# Patient Record
Sex: Male | Born: 1945 | Race: White | Marital: Single | Smoking: Never smoker
Health system: Northeastern US, Academic
[De-identification: ages and names within clinical notes are randomized; demographics above are authoritative.]

## PROBLEM LIST (undated history)

## (undated) DIAGNOSIS — E785 Hyperlipidemia, unspecified: Secondary | ICD-10-CM

## (undated) DIAGNOSIS — K219 Gastro-esophageal reflux disease without esophagitis: Secondary | ICD-10-CM

## (undated) DIAGNOSIS — G4730 Sleep apnea, unspecified: Secondary | ICD-10-CM

## (undated) DIAGNOSIS — S42123A Displaced fracture of acromial process, unspecified shoulder, initial encounter for closed fracture: Secondary | ICD-10-CM

## (undated) DIAGNOSIS — I10 Essential (primary) hypertension: Secondary | ICD-10-CM

## (undated) DIAGNOSIS — M1990 Unspecified osteoarthritis, unspecified site: Secondary | ICD-10-CM

## (undated) DIAGNOSIS — K922 Gastrointestinal hemorrhage, unspecified: Secondary | ICD-10-CM

## (undated) DIAGNOSIS — H35371 Puckering of macula, right eye: Secondary | ICD-10-CM

## (undated) DIAGNOSIS — H53141 Visual discomfort, right eye: Secondary | ICD-10-CM

## (undated) HISTORY — PX: CONVERTED PROCEDURE: SHX9999

## (undated) HISTORY — DX: Visual discomfort, right eye: H53.141

## (undated) HISTORY — DX: Puckering of macula, right eye: H35.371

## (undated) HISTORY — PX: ELBOW SURGERY: SHX618

## (undated) HISTORY — PX: TOTAL KNEE ARTHROPLASTY: SHX125

## (undated) HISTORY — PX: APPENDECTOMY: SHX54

## (undated) HISTORY — PX: CARPAL TUNNEL RELEASE: SHX101

## (undated) HISTORY — PX: SHOULDER SURGERY: SHX246

---

## 1999-01-15 ENCOUNTER — Inpatient Hospital Stay (HOSPITAL_COMMUNITY): Admission: RE | Admit: 1999-01-15 | Discharge: 1999-01-19 | Payer: Self-pay | Admitting: Orthopedic Surgery

## 1999-01-18 ENCOUNTER — Encounter: Payer: Self-pay | Admitting: Orthopedic Surgery

## 2000-07-07 ENCOUNTER — Encounter (INDEPENDENT_AMBULATORY_CARE_PROVIDER_SITE_OTHER): Payer: Self-pay

## 2000-07-07 ENCOUNTER — Ambulatory Visit (HOSPITAL_COMMUNITY): Admission: RE | Admit: 2000-07-07 | Discharge: 2000-07-07 | Payer: Self-pay | Admitting: Gastroenterology

## 2001-05-28 ENCOUNTER — Encounter (INDEPENDENT_AMBULATORY_CARE_PROVIDER_SITE_OTHER): Payer: Self-pay | Admitting: Specialist

## 2001-05-28 ENCOUNTER — Ambulatory Visit (HOSPITAL_COMMUNITY): Admission: RE | Admit: 2001-05-28 | Discharge: 2001-05-28 | Payer: Self-pay | Admitting: Gastroenterology

## 2002-12-09 ENCOUNTER — Encounter (INDEPENDENT_AMBULATORY_CARE_PROVIDER_SITE_OTHER): Payer: Self-pay | Admitting: *Deleted

## 2002-12-09 ENCOUNTER — Ambulatory Visit (HOSPITAL_COMMUNITY): Admission: RE | Admit: 2002-12-09 | Discharge: 2002-12-09 | Payer: Self-pay | Admitting: Gastroenterology

## 2005-10-31 ENCOUNTER — Encounter: Admission: RE | Admit: 2005-10-31 | Discharge: 2005-10-31 | Payer: Self-pay | Admitting: Gastroenterology

## 2006-12-10 IMAGING — CT CT ABDOMEN W/ CM
3 of 5 series · 13 of 42 positions shown, 19 images · IV contrast (READY/WATER & [ID] [ID])
Comparison: None.
COMPARISON: None.

CLINICAL DATA: Eight days of left lower quadrant tenderness, cramping.  On antibiotics for one day.  Appendectomy.
ABDOMEN CT WITH CONTRAST:
TECHNIQUE: Multidetector CT imaging of the abdomen was performed following the standard protocol during bolus administration of intravenous contrast.
Contrast:  100 cc of Omnipaque 300.
TECHNIQUE: Multidetector CT imaging of the pelvis was performed following the standard protocol during bolus administration of intravenous contrast.

[Series 2: a&p w/ · axial · 0.70mm/px · z∈[-396,-50]mm · 8 of 88 slices shown, 13 images (1 of 2)]
[im 10/88  soft-tissue]
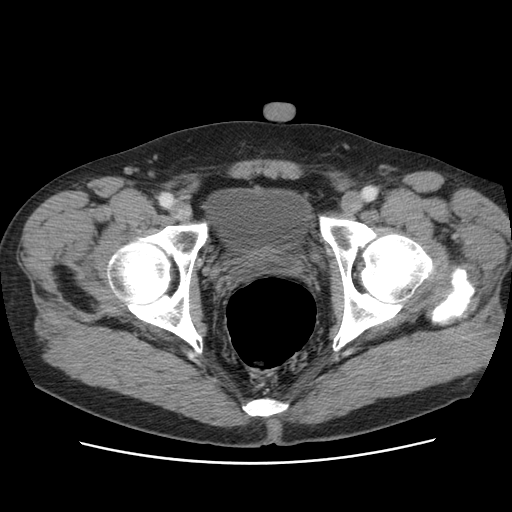
[im 10/88  bone]
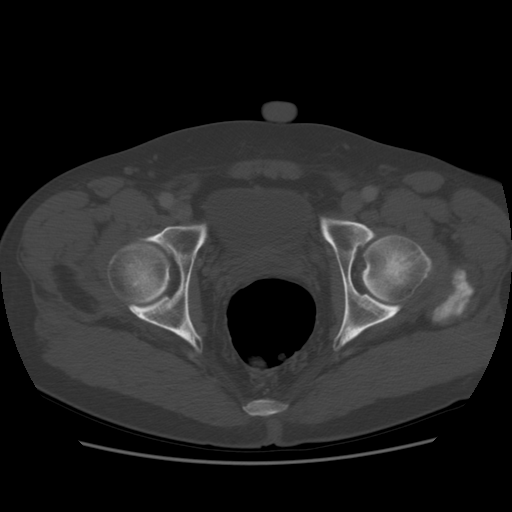
[im 20/88  soft-tissue]
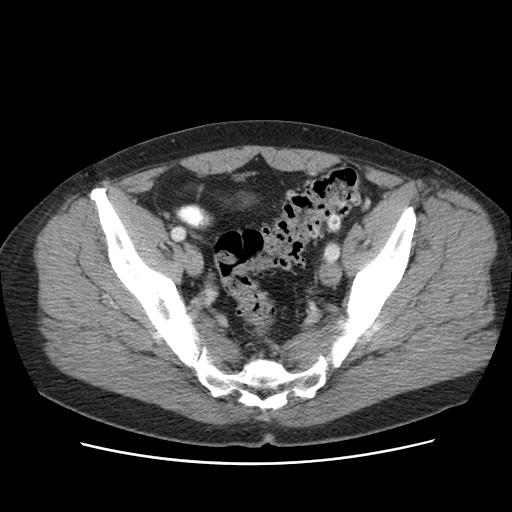
[im 30/88  soft-tissue]
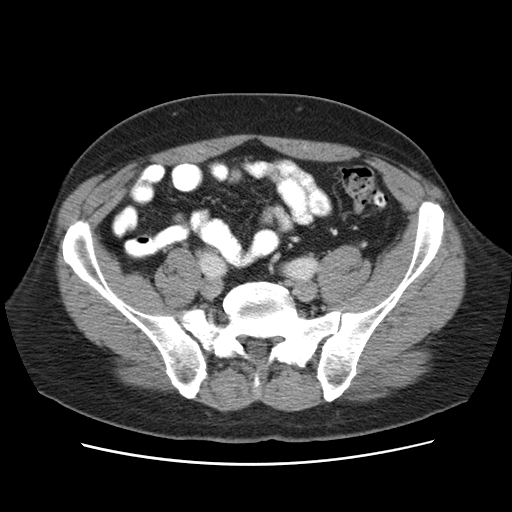
[im 39/88  soft-tissue]
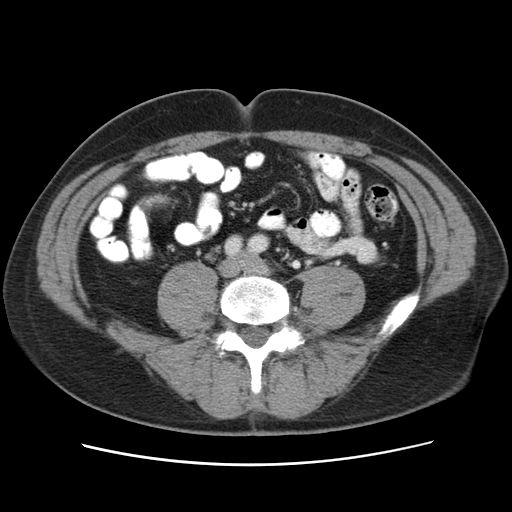
[im 49/88  soft-tissue]
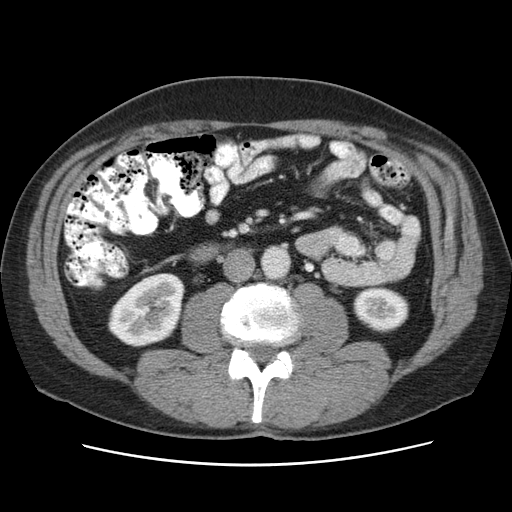
[im 49/88  lung]
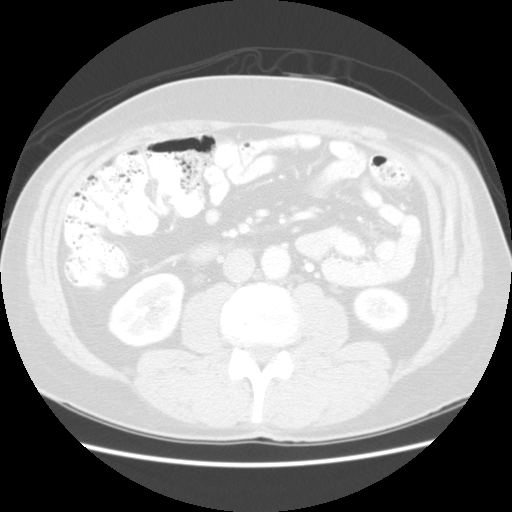
[im 59/88  soft-tissue]
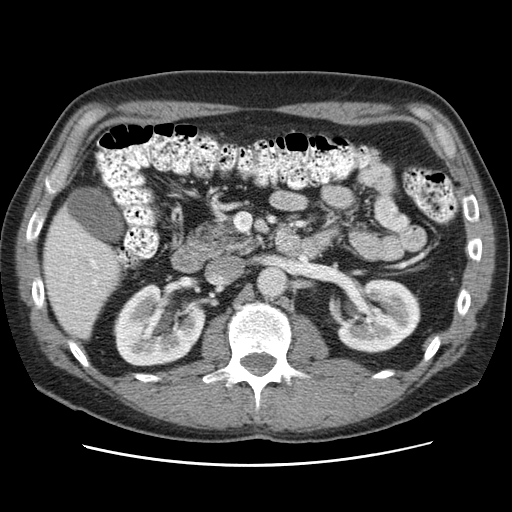
[im 59/88  lung]
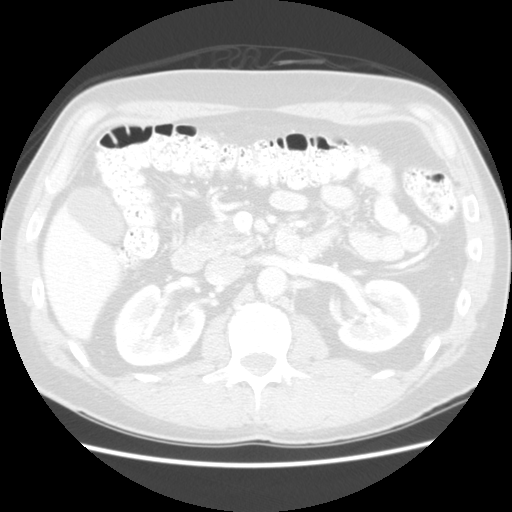
[im 68/88  soft-tissue]
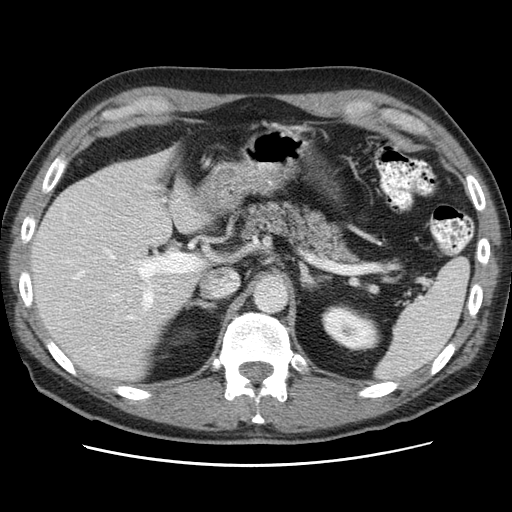
[im 68/88  lung]
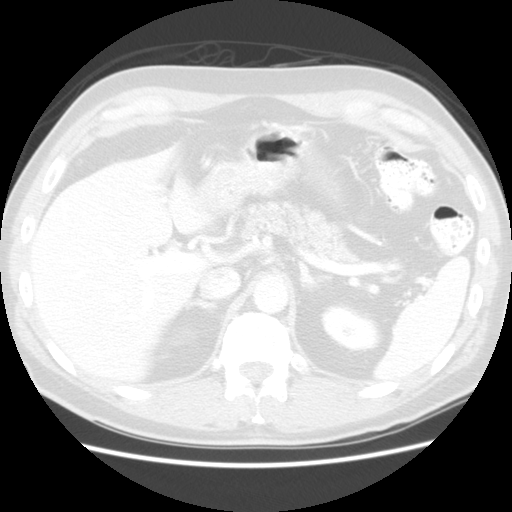
[im 78/88  soft-tissue]
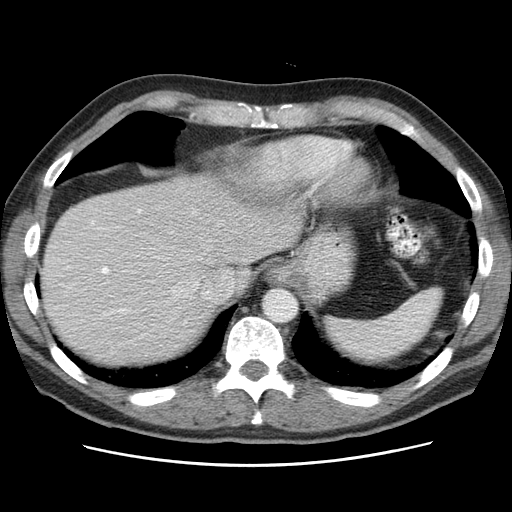
[im 78/88  lung]
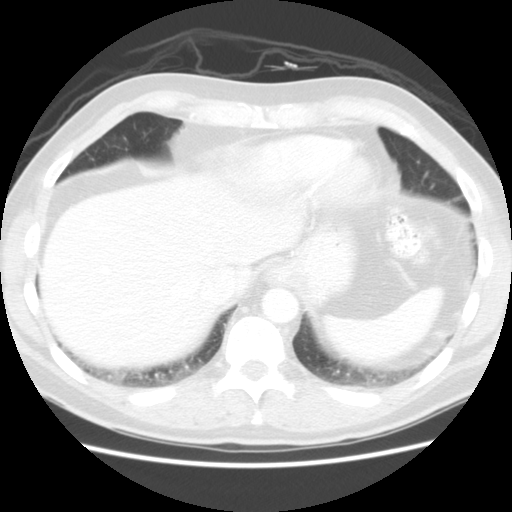

[Series 102: a&p w/ · axial · 0.70mm/px · z∈[-168,-146]mm · 2 of 152 slices shown (2 of 2)]
[im 18/152  soft-tissue]
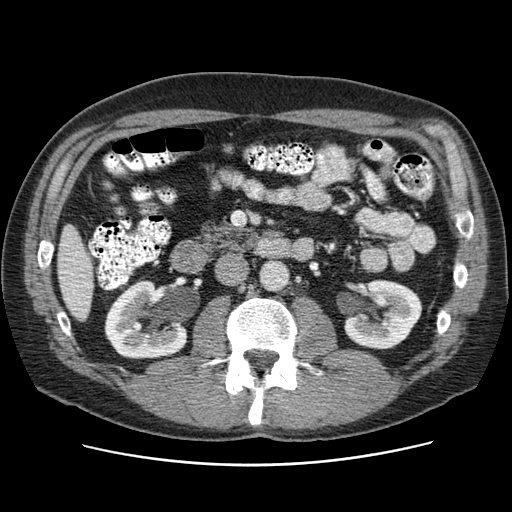
[im 36/152  soft-tissue]
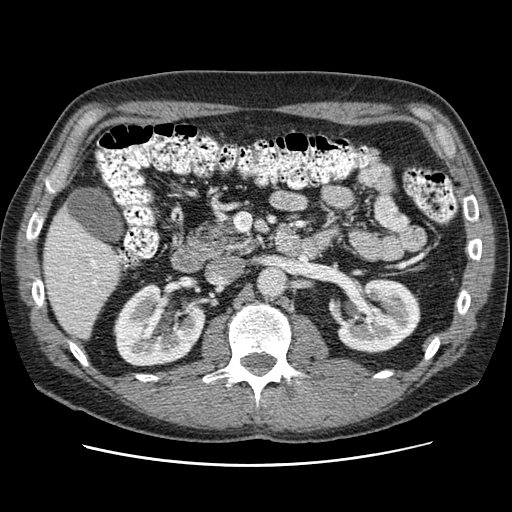

[Series 408: reformatted · sagittal · 0.92mm/px · 3 of 142 slices shown, 4 images]
[im 29/142  soft-tissue]
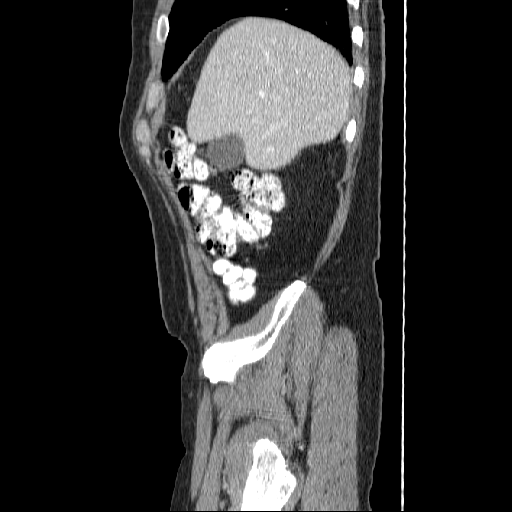
[im 57/142  soft-tissue]
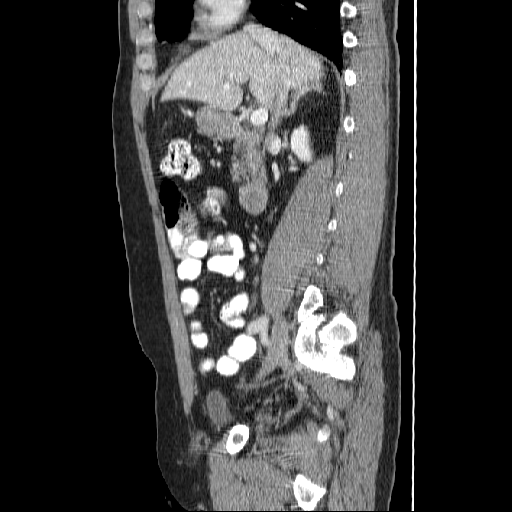
[im 57/142  bone]
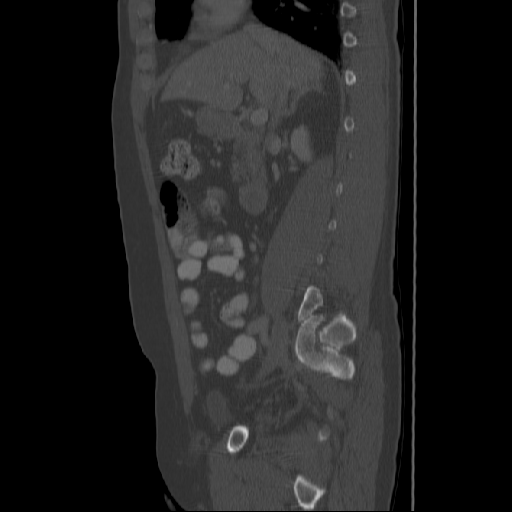
[im 85/142  soft-tissue]
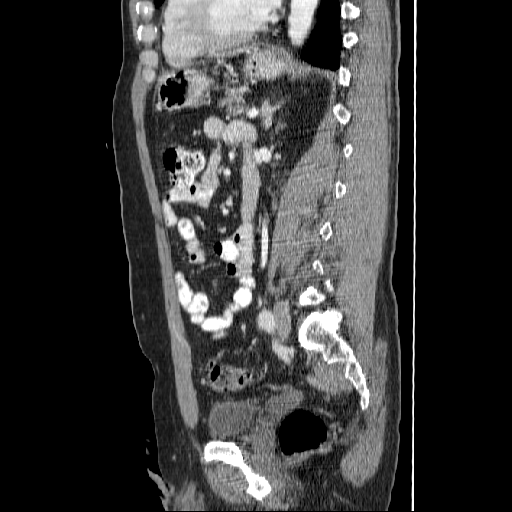

[13 of 42 positions shown; findings below may reference images not displayed]

The abdominal parenchymal organs are unremarkable.  There is no evidence of mass or adenopathy.  No inflammatory process or abnormal fluid collections are identified.  No other significant abnormality is noted.  Bilateral extrarenal pelves anatomic variant and tiny cortical renal cysts are seen at the lower pole kidneys with incidental very small hiatus hernia and slight constipation findings.
IMPRESSION: 1.  Slight constipation, tiny renal cortical cysts, and very small hiatus hernia.
2.  Otherwise negative abdomen CT.
PELVIS CT WITH CONTRAST:
FINDINGS: Slight to moderate descending sigmoid diverticulosis is seen with mild stranding at the proximal sigmoid region of minimal acute diverticulitis findings.  No complicating perforation, large phlegmon, nor abscess is seen with no free fluid nor free intraperitoneal air.  Slight constipation findings present.  Patient is post appendectomy with remaining organs unremarkable.
IMPRESSION: 1.  Descending sigmoid diverticulosis with minimal acute diverticulitis findings at the level of the proximal sigmoid.
2.  Otherwise negative.

## 2007-01-10 DIAGNOSIS — I2510 Atherosclerotic heart disease of native coronary artery without angina pectoris: Secondary | ICD-10-CM | POA: Insufficient documentation

## 2007-03-24 ENCOUNTER — Ambulatory Visit (HOSPITAL_BASED_OUTPATIENT_CLINIC_OR_DEPARTMENT_OTHER): Admission: RE | Admit: 2007-03-24 | Discharge: 2007-03-24 | Payer: Self-pay | Admitting: Orthopedic Surgery

## 2008-07-07 ENCOUNTER — Other Ambulatory Visit: Payer: Self-pay | Admitting: Cardiology

## 2008-08-23 ENCOUNTER — Encounter: Admission: RE | Admit: 2008-08-23 | Discharge: 2008-08-23 | Payer: Self-pay | Admitting: Gastroenterology

## 2008-09-16 ENCOUNTER — Other Ambulatory Visit: Payer: Self-pay | Admitting: Cardiology

## 2008-10-06 ENCOUNTER — Other Ambulatory Visit: Payer: Self-pay

## 2008-10-06 ENCOUNTER — Encounter: Payer: Self-pay | Admitting: Cardiology

## 2008-10-06 DIAGNOSIS — I2510 Atherosclerotic heart disease of native coronary artery without angina pectoris: Secondary | ICD-10-CM | POA: Insufficient documentation

## 2008-10-06 DIAGNOSIS — E785 Hyperlipidemia, unspecified: Secondary | ICD-10-CM | POA: Insufficient documentation

## 2008-10-06 DIAGNOSIS — I498 Other specified cardiac arrhythmias: Secondary | ICD-10-CM | POA: Insufficient documentation

## 2008-10-06 HISTORY — DX: Other specified cardiac arrhythmias: I49.8

## 2008-10-27 ENCOUNTER — Other Ambulatory Visit: Payer: Self-pay | Admitting: Cardiology

## 2008-12-01 ENCOUNTER — Encounter: Payer: Self-pay | Admitting: Cardiology

## 2008-12-01 ENCOUNTER — Other Ambulatory Visit: Payer: Self-pay

## 2009-05-30 ENCOUNTER — Ambulatory Visit: Payer: Self-pay | Admitting: Cardiology

## 2009-06-01 ENCOUNTER — Encounter: Payer: Self-pay | Admitting: Gastroenterology

## 2009-06-01 ENCOUNTER — Ambulatory Visit: Payer: Self-pay | Admitting: Cardiology

## 2009-06-06 NOTE — Consults (Signed)
Client Name: Terry, Grant  Client ID (MRN): [id number]  Care Terry Grant (Primary): Dr. Cindra Eves  Care Terry Grant (Referral): Dr. Cindra Eves  Care Terry Grant (Service): Macky Lower. Terry Medicus, MD  Client Date of Birth (Age): 02-09-1946 (64 years old)  Visit Date (Time): 06/01/2009 (11:00 AM)  Visit Service Area/Type: CLIN: Office Visit (Follow up)  Visit Patient Type: Outpatient  Visit Lock Code (Date/Time): [phone] (06/06/2009 at 11:24:51)                                 ******BODY OF REPORT******    Dear Dr. Omelia Blackwater:    I was privileged to see your patient, Terry Grant, in the [facility] Office today in routine follow   up.  The patient was initially seen by me 07/07/2008 for initial visit   at which time he indicated he had premature ventricular contractions   and nonsustained ventricular tachycardia during stent and had a history   of coronary disease and was concerned about these findings. They   appeared to be associated with palpitations although there was a lack   of correlation of symptoms and ectopy by Holter noted in the 03/2008   [facility] evaluation.  The patient expressed concern about the   nonsustained ventricular tachycardia and I advised him that this was   likely not of important significance to his health status.  He does   have a history of dyslipidemia and multiple coronary risk factors and   had a slight orthostatic drop of 10 mm from supine to standing   position.    Today in clinic the patient indicates I had advised him to discontinue   the use of Xanax and he indicates this was difficult for him but he   eventually got off of it which was difficult.  He experienced   significant depression, he reports, after getting of the Xanax and has   found this to be a significant adjustment.  He says he has not felt   well since stopping the Xanax.  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  He   says he gets up and walks around when he is twitching so much, this is   often noted in the morning.  He has been taking coated aspirin every   other day on an empty stomach, he indicates.  He says that irregular   heartbeats and PVCs he thinks were higher on the Xanax and he wonders   if a beta-blocker should be used.  He describes a 10 pound weight loss   and is frequently running, but seems to be unable to regain the weight.    He denies other cardiac symptoms such as unusual fatigue, chest pain,   dyspnea, or other cardiac symptom.    MEDICATIONS:  Medications were reviewed and include:  1.   Aspirin every other day.  2.   Plavix 75 mg daily.  3.   Crestor 10 mg daily.  4.   Fish oil 1,200 mg daily over-the-counter.  5.   Vitamin B.    REVIEW OF SYSTEMS:  The patient complains of some twitches in the interosseous muscles of   his hands particularly between the index finger and thumb on  each hand.    PHYSICAL EXAMINATION:  General:  He is a thin middle aged gentleman appearing well and in no   acute distress.  Vital Signs:  Blood pressure is 113/73, heart rate 83 and regular.    Weight 153 pounds.  Neck:  There is no jugular venous hypertension.  Lungs:  Clear to percussion and auscultation.  Cardiovascular:  Cardiac tones are normal without gallop or murmur.    Peripheral pulses are 3+ to 4+ and symmetrically intact in the upper   and lower extremities.  Comprehensive cardiovascular examination   reveals no aortic abdominal pathology.  Abdomen:  There is no organomegaly, mass or tenderness.  Extremities:  Without clubbing, cyanosis or edema.  Skin:  Unremarkable.  Neurologic:  He appears grossly alert and oriented with no normal   motor, sensory, cerebellar, gait and cranial nerve function.    ELECTROCARDIOGRAM:  I did not obtain a follow up electrocardiogram today.    IMPRESSION:  1.   Terry Grant is a 64 year old man with a history of  coronary disease   treated with angioplasty and 4 drug eluting stents in 01/2008 by Dr.   Cleatis Polka in [place].  Because of the drug eluting stents it is   recommended that he continue with Plavix therapy on an ongoing basis   until it becomes more firmly established and the literature as to when   an optimal time for discontinuation might be in the presence of drug   eluting stents.  Certainly Crestor 10 mg is useful for reducing his   risk of recurrence coronary disease events, although it is not clear   that he had an acute coronary syndrome based on the history he has   provided prior to the placement of stents which by angiography clearly   did indicate the presence of significant anatomic coronary disease.  2.   He has palpitations of unclear etiology.  They do not seem to be   associated with defined ectopy.  There seems to be no correlation of   his symptoms and objective evidence of dysrhythmia at the time of his   Holter monitor last year.  3.   His lipids are well controlled with current therapy and given the   presence of disease I would recommend ongoing therapy with lipid   management.  4.   He complains of fatigue an post-Xanax depression, and wonders   about the use of beta-blockade.  I have advised him that while   beta-blockade can be used therapeutically to reduce anxiety and has   been used effectively to reduce anxiety associated with public   speaking, it does have a potential downside of worsening depression to   which he may be malleable.  Since the use of beta-blockade in this   context would be non-cardiac, I have urged him to work with you in   order to establish more properly the risks and benefits of this form of   therapy if it used for purposes of control of anxiety.  5.   History of interosseous muscle twitching.  I have not observed any   fasciculations on examination, and have asked him to follow up with you   for primary care assessment of the significance of this symptom he   reports  of interosseous muscle twitching.  6.   I have asked the patient to take aspirin with food in his stomach   and to avoid taking aspirin on an empty stomach as this may  initiate or   enhance GI irritability.    I hope this cardiovascular consultation has been of assistance to you   and your management of Terry Grant and hope you will not hesitate to call   for any questions or concerns that may arise.        Sincerely,  ***Electronically signed by***  _________________________  Macky Lower. Terry Medicus, MD  Professor of Medicine                            ******REPORT DISTRIBUTION LIST******    PRIMARY CARE Klare Criss: Stefano Gaul Elver Stadler: Cindra Eves

## 2009-11-30 ENCOUNTER — Ambulatory Visit: Payer: Self-pay | Admitting: Cardiology

## 2009-11-30 ENCOUNTER — Other Ambulatory Visit: Payer: Self-pay | Admitting: Gastroenterology

## 2009-11-30 NOTE — Progress Notes (Addendum)
Cardiology   Dear Dr. Dayna Barker Terry Grant:     I had the pleasure of seeing your patient Terry Grant in the Outpatient   Cardiology Clinic at the [facility] on November 30, 2009. As   you know, Terry Grant is a 64 year old male with a history of CAD s/p DES to   RCA, hyperlipidemia, SVT.  Terry Grant presents today for routine follow-up.    His main complaint today is a  continued sensation of palpitations   associated with "adrenaline rushes" such as watching [place]   football, and occasionally playing golf.  Previous evaluation with a Holter   monitor did not correlate these episodes with any arrythmia.  He denies   chest pain, shortness of breath. He is eating a healthy diet and exercises   daily for 60 minutes.       He has multiple questions today including if he can.  Allergies   No Known Drug Allergy.  Current Meds   Aspirin 81 MG Tablet;TAKE 1 TABLET EVERY OTHER DAY; RPT  Plavix 75 MG Tablet;TAKE 1 TABLET DAILY.; RPT  Fish Oil 1200 MG Capsule;TAKE 2 CAPSULE DAILY; RPT  Vitamin B Complex Capsule;TAKE 1 CAPSULE DAILY.; RPT  Vitamin D CAPS;TAKE 1 CAPSULE DAILY; RPT  Crestor 10 MG Tablet;TAKE 1 TABLET DAILY.; RPT  Magnesium 250 MG Tablet;; RPT.  Active Problems   Cath Stent 1 Distal Right Coronary Artery 10 Jan 2007  Coronary Artery Disease (414.00)  Dyslipidemia (272.4)  Supraventricular Tachycardia (427.89).  Vital Signs   Recorded by PAYETTE,JENNIFER on 30 Nov 2009 10:49 AM  BP:116/67,  RUE,  Sitting,   HR: 65 b/min,   Weight: 151 lb,   Pain Scale: 0.  Physical Exam   Gen: NAD, AandO  HEENT: anicteric, MMM  Pulm: CTA(B), normal respiratory effort  CV: RRR no M/R/G.  no carotid bruits.  2+ radial pulses b/l.  no palpable   abdominal masses.  Ext: no edema  .  Plan   1.  Hyperlipidemia - dyslipidemia is well controlled on crestor 10 with LDL   of 73 and HDL > 100.    -At this point would advise no in current regimen of crestor 10mg .      2.  CAD s/p DES to RCA - patient remains asymptomatic without  signs or   symptoms of ongoing ischemia.  -advised to continue aspirin and plavix.  Terry Grant was interested in   stopping plavix, however, given the risk of restenosis we advised against   stopping it.       3.  Palpitations - Previous holter monitor exams did not correlate symptoms   with concerning arrythmia.   As the symptoms improve with exercise they are   quite unlikely to be caused by ischemia.  As the symptoms are distressing   to him we have recommended a very low dose beta-blocker, given his low   resting HR in the 60s.    -toprol xl 12.5mg          .  Signature   Electronically signed by: Wendee Beavers  MD - Res.; 11/30/2009 2:07 PM EST.

## 2009-12-06 LAB — EKG 12-LEAD
P: 76 degrees
PR: 172 ms
QRS: 120 degrees
QRSD: 143 ms
QT: 448 ms
QTc: 448 ms
Rate: 60 {beats}/min
Severity: ABNORMAL
T: 22 degrees

## 2010-03-20 NOTE — Miscellaneous (Unsigned)
Continuity of Care Record  Created: todo  From: LUTHER, RAMESH  From:   From: [employer], EHR v10.2.7.53  To: Melinda Crutch  Purpose: Patient Use;       Problems  Diagnosis: Coronary Artery Disease (414.00)   Diagnosis: Dyslipidemia (272.4)   Diagnosis: Supraventricular Tachycardia (427.89)   Problem: Cath Stent 1 Distal Right Coronary Artery 10 Jan 2007    Alerts  Allergy - No Known Drug Allergy     Medications  Aspirin 81 MG Tablet; TAKE 1 TABLET EVERY OTHER DAY ; RPT   Crestor 10 MG Tablet; TAKE 1 TABLET DAILY. ; RPT   Fish Oil 1200 MG Capsule; TAKE 2 CAPSULE DAILY ; RPT   Magnesium 250 MG Tablet ; RPT   Metoprolol Succinate 25 MG Tablet Extended Release 24 Hour; TAKE 0.5 TABLET   DAILY ; Rx   Plavix 75 MG Tablet; TAKE 1 TABLET DAILY. ; RPT   Vitamin B Complex Capsule; TAKE 1 CAPSULE DAILY. ; RPT   Vitamin D CAPS; TAKE 1 CAPSULE DAILY ; RPT

## 2010-06-05 ENCOUNTER — Ambulatory Visit: Payer: Self-pay | Admitting: Cardiology

## 2010-06-14 ENCOUNTER — Ambulatory Visit: Payer: Self-pay | Admitting: Cardiology

## 2010-06-26 ENCOUNTER — Ambulatory Visit: Payer: Self-pay | Admitting: Cardiology

## 2010-07-24 NOTE — Op Note (Signed)
NAME:  Roger Hays, Roger Hays                 ACCOUNT NO.:  [id number]   MEDICAL RECORD NO.:  [id number]          PATIENT TYPE:  AMB   LOCATION:  DSC                          FACILITY:  [facility]   PHYSICIAN:  Robert A. Thurston Hole, M.D. DATE OF BIRTH:  Sep 13, 1945   DATE OF PROCEDURE:  03/24/2007  DATE OF DISCHARGE:                               OPERATIVE REPORT   PREOPERATIVE DIAGNOSES:  1. Right elbow lateral epicondylitis with partial tear.  2. Right elbow medial epicondylitis.   POSTOPERATIVE DIAGNOSES:  1. Right elbow lateral epicondylitis with partial tear.  2. Right elbow medial epicondylitis.   PROCEDURE:  1. Right elbow lateral release with debridement.  2. Partial lateral epicondylectomy and repair.  3. Right elbow medial epicondylar injection.   SURGEON:  Salvatore Marvel, MD.   ASSISTANT:  Julien Girt, PA.   ANESTHESIA:  General.   OPERATIVE TIME:  45 minutes.   COMPLICATIONS:  None.   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  He is now to undergo lateral release  and medial epicondylar injection.   DESCRIPTION:  Roger Hays was brought to the operating room on March 24, 2007, placed on the operative table in the supine position.  After an  adequate level of general anesthesia was obtained his right elbow was  examined.  He had full range of motion and his elbow was stable to  ligamentous exam.  The right arm was prepped using sterile DuraPrep and  draped using sterile technique.  He received Ancef 1 gram IV  preoperatively for prophylaxis.  The arm was exsanguinated and a  tourniquet elevated at 275 mm.  Initially, through a 3 cm curvilinear  lateral incision based over the lateral epicondyle  initial exposure was  made.  The underlying subcutaneous tissues were incised.  The fascia  over the ECRB and ECRL tendons was incised longitudinally.  The ECRB and  ECRL tendons showed partial tearing and the partially torn pieces were  debrided and there was release of the ECRB an ECRL tendon.  Underneath  this there was a spur noted in the lateral epicondyle which was removed  with a rongeur and then smoothed down with a rasp.  The radial head  capitella joint was not entered.  After the debridement was carried out  back to good healthy tendon, multiple small drill holes were placed in  the lateral epicondyle and then #2-0 FiberWire was placed through these  drill holes and through the healthy appearing tendon and then tied down,  reattaching the tendon 2-3 mm distal to its original attachment under  less tension with a firm and tight repair.  The radial nerve was  carefully protected during this portion of the procedure and during the  exposure.  After this was done the elbow could be brought through a full  range  of motion with no excessive tension on the repair.  The wound was  then irrigated and the fascia was closed over the repair with #2-0  Vicryl suture, subcutaneous tissue was closed with #2-0 Vicryl,  subcuticular layer closed with #4-0 Prolene.  At this point, the medial  epicondyle was injected sterilely with 40 mg of Depo-Medrol and 2 mL of  0.25% Marcaine staying away from the ulnar nerve.  After this was done  sterile dressings and a long-arm splint was applied after the lateral  incision was also injected with 0.25% Marcaine.  A long-arm splint was  placed, tourniquet was released and then the patient awakened and taken  to the recovery room in stable condition.   FOLLOWUP CARE:  1. Roger Hays will be followed as an outpatient on Darvocet, Dilaudid      if needed, and Robaxin.  2. See me back in the office in a week for wound check and followup.   Needle and sponge  counts correct x2 at the end of the case.      Robert A. Thurston Hole, M.D.  Electronically Signed     RAW/MEDQ  D:  03/24/2007  T:  03/24/2007  Job:  [id number]

## 2010-07-27 NOTE — Procedures (Signed)
[facility]  Patient:    Roger Hays, Roger Hays                        MRN: [id number] Proc. Date: 07/07/00 Adm. Date:  [id number] Attending:  Rich Brave CC:         Dr. Arlis Porta   Procedure Report  PROCEDURE:  Upper endoscopy with biopsies.  SURGEON:  Florencia Reasons, M.D.  INDICATIONS:  A 65 year old gentleman for evaluation of longstanding reflux and tendency toward loose stools.  FINDINGS:  A 3 cm segment of Barretts esophagus above a 3 cm hiatal hernia.  DESCRIPTION OF PROCEDURE:  The nature, purpose, and risks of the procedure had been discussed with the patient who provided written consent.  Sedation was fentanyl 62.5 mcg and Versed 7 mg IV without arrhythmias or desaturation.  The Olympus video endoscope was passed under direct vision.  The vocal cords were basically normal, perhaps a little bit of inflammatory changes from reflux were present such as some thickening in the posterior commissure.  The proximal esophagus was normal, being entered very easily under direct vision.  The distal esophagus had several tongues of typical Barretts-appearing mucosa, extending a total of about 3 cm above the lower esophageal sphincter.  There was a 3 cm hiatal hernia present without any ring or stricture.  No varices, infection, or neoplasia were noted.  The stomach contained a small clear residual which was suctioned up.  The gastric mucosa was normal, without evidence of gastritis, erosions, ulcers, polyps, or masses.  A retroflexed view of the proximal stomach showed the hiatal hernia from its inferior perspective, but was otherwise normal.  The diaphragmatic hiatus was not particularly patchulous.  The pylorus, duodenal bulb, and second duodenum were unremarkable in appearance.  Biopsies obtained during this procedure included antral biopsies for CLOtesting, random duodenal biopsies to rule celiac disease in view of the several year history of  loose stools, and biopsies from the Barretts segment of the esophagus.  The procedure was well-tolerated by the patient and there were no apparent complications.  IMPRESSION: 1. Barretts esophagus. 2. Medium-sized hiatal hernia. 3. No definite source of diarrhea or loose stools identified.  PLAN:  Await results of biopsies and CLOtest.  Proceed to colonoscopic evaluation. DD:  07/07/00 TD:  07/07/00 Job: [id number]

## 2010-07-27 NOTE — Op Note (Signed)
   NAME:  [name], [name] NO.:  [id number]   MEDICAL RECORD NO.:  [id number]                   PATIENT TYPE:  AMB   LOCATION:  ENDO                                 FACILITY:  [facility]   PHYSICIAN:  Bernette Redbird, M.D.                DATE OF BIRTH:  [birth year]   DATE OF PROCEDURE:  12/09/2002  DATE OF DISCHARGE:                                 OPERATIVE REPORT   PROCEDURE:  Upper endoscopy with biopsies.   INDICATIONS:  Followup of focal atypia in a Barrett's segment of a 65-year-  old gentleman, last endoscoped about 1-1/2 years ago and maintained on PPI  therapy.   FINDINGS:  Short segment Barrett's esophagus with free reflux noted.  Small  hiatal hernia.   PROCEDURE:  The nature, purpose, and risks of the procedure were familiar to  the patient, who provided written consent.   SEDATION:  Fentanyl 75 micrograms and Versed 7.5 mg IV without arrhythmias  or desaturation.   The Olympus small caliber adult video endoscope was passed under direct  vision.  The vocal cords looked grossly normal.  The esophagus was readily  entered and was normal except for its last few centimeters where there was a  broken up segment of Barrett's esophagus with islands of squamous mucosa.  No active esophagitis was noted, but the patient did have free reflux.  No  ring or stricture was appreciated, but the patient had a 2 cm hiatal hernia.  The stomach contained a small clear residual which was suctioned up and the  gastric mucosa was normal including a retroflexed view of the cardia.  The  pylorus, duodenal bulb, and second duodenum were grossly normal.   Approximately 12 biopsies were obtained from the distal esophagus Barrett's  segment prior to removal of the scope.  The patient tolerated the procedure  well and there were no apparent complications.   IMPRESSION:  Short segment Barrett's esophagus above small hiatal hernia.   PLAN:  Await pathology with followup  endoscopy to be determined according to  the histologic findings.                                               Bernette Redbird, M.D.    RB/MEDQ  D:  12/09/2002  T:  12/09/2002  Job:  [id number]   cc:   Newt Minion  [address], [place]  [place]  [place] [address]  Fax: [phone]

## 2010-07-27 NOTE — Procedures (Signed)
[facility]  Patient:    Roger Hays, Roger Hays Visit Number: [id number] MRN: [id number]          Service Type: END Location: ENDO Attending Physician:  Rich Brave Dictated by:   Florencia Reasons, M.D. Proc. Date: 05/28/01 Admit Date:  05/28/2001 Discharge Date: 05/28/2001   CC:         Newt Minion, M.D., [facility], [place]   Procedure Report  PROCEDURE:  Upper endoscopy with biopsies.  INDICATION:  A 65 year old for follow up of Barretts esophagus.  Endoscopy approximately a year ago showed biopsies which were indeterminate for dysplasia.  The patient has been maintained on PPI therapy in the interim.  FINDINGS:  A 2-3 cm segment of Barretts esophagus above 2-3 cm hiatal hernia.  DESCRIPTION OF PROCEDURE:  The nature, purpose, and risks of the procedure were familiar to the patient from prior examination, and he provided written consent.  Sedation was fentanyl 100 mcg and Versed 10 mg IV without arrhythmias or desaturation.  The scope was passed under direct vision,  The larynx and vocal cords looked normal.  The esophagus was readily entered.  The esophagus was normal all the way down to the Barretts segment which began at about 35 cm from the mouth, and extended for a distance of 2-3 cm.  There was a 2-3 cm hiatal hernia present as well.  The stomach contained no significant residual and had no evidence of gastritis, erosions, ulcers, polyps, or masses including a retroflexed view of the proximal stomach which showed just a slightly patulous diaphragmatic hiatus.  The pylorus, duodenal bulb, and second duodenum looked normal.  The esophagus was free of varices, infection, or any evident mass effect or neoplasia.  There was no free reflux noted nor any evidence endoscopically of active inflammation.  Approximately 15 biopsies were obtained from the Barretts segment prior to removal of the scope.  The patient tolerated the procedure well,  and there were no apparent complications.  IMPRESSION:  Short segment Barretts esophagus above small hiatal hernia.  PLAN:  Await pathology. Dictated by:   Florencia Reasons, M.D. Attending Physician:  Rich Brave DD:  05/28/01 TD:  05/30/01 Job: [id number]/[id number]

## 2010-07-27 NOTE — Procedures (Signed)
[facility]  Patient:    Roger Hays, Roger Hays                        MRN: [id number] Proc. Date: 07/07/00 Adm. Date:  [id number] Attending:  Rich Brave CC:         Newt Minion, M.D.   Procedure Report  PROCEDURE:  Colonoscopy with biopsies.  ENDOSCOPIST:  Florencia Reasons, M.D.  INDICATIONS:  A 65 year old with intermittent rectal bleeding and change in bowel habits toward loose stools over the past several years.  FINDINGS:  Internal hemorrhoids and sigmoid diverticulosis.  DESCRIPTION OF PROCEDURE:  The nature, purpose, and risks of the procedure had been discussed with the patient who provided written consent.  Sedation for this procedure and the upper endoscopy which preceded it totalled fentanyl 62.5 mcg and Versed 9 mg IV without arrhythmias or desaturation.  Digital exam of the prostate was normal.  The Olympus adult video colonoscope was readily advanced to the terminal ileum, and pullback was then performed.  The quality of the prep was excellent, and it is felt that all areas were well seen.  The terminal ileum had a norma appearance with a little bit of lymphoid hyperplasia.  Several biopsies were obtained from it.  The colonic mucosal appearance was also normal without evidence of colitis or vascular malformations.  No polys or masses were observed.  I did not see any vascular malformations.  Random mucosal biopsies were obtained along the length of the colon and rectum.  There was mild to moderate sigmoid diverticulosis.  Retroflexion in the rectum showed moderate internal hemorrhoids.  The patient tolerated the procedure well, and there were no apparent complications.  IMPRESSION: 1. Internal hemorrhoids which most likely account for the patients rectal    bleeding. 2. Sigmoid diverticulosis. 3. No obvious source for diarrhea or loose stools identified; random    mucosal biopsies pending.  PLAN:  Await  pathology. DD:  07/07/00 TD:  07/07/00 Job: [id number]

## 2010-07-27 NOTE — Procedures (Signed)
[facility]  Patient:    Roger Hays                         MRN: [id number] Proc. Date: 01/15/99 Adm. Date:  [id number] Attending:  Twana First CC:         Bedelia Person, M.D.                           Procedure Report  PROCEDURE:  Epidural catheter placement for postoperative epidural Fentanyl pain control.  ANESTHESIOLOGIST:  Bedelia Person, M.D.  INDICATIONS:  Prior to the surgical procedure, the risks and benefits of postop  epidural Fentanyl for pain control were discussed with the patient by Kaylyn Layer. Michelle Piper, M.D.  Dr. Michelle Piper listed the risks of nerve damage, infection, allergic reaction to the anesthetic and in consultation with the patients primary surgeon, Molly Maduro A. Thurston Hole, M.D., the patient elected to proceed with the technique for postoperative pain control.  DESCRIPTION OF PROCEDURE:  After the surgical dressing was applied, patient was  turned to the full right lateral decubitus position.  Betadine prep x 3 was performed on the lumbar region.  Sterile technique was used.  A #17-gauge Tuohy  needle was inserted in a midline approach at the L3-4 interspace.  Using a loss of resistance, the epidural space was located on the first pass.  There was no blood or CSF noted.  A non-stiletted catheter was then placed 6 cm into the epidural space and the needle was removed.  There was again negative aspiration.  A mixture of preservative-free Xylocaine 1%, 5 cc, and 0.9 normal saline, 10 cc, and 100 cg of Fentanyl was slowly injected into the epidural catheter.  Catheter was securely adhesed to the patients back using Hypafix dressing over 2 x 2 sterile gauze. he patient was then returned to the supine position, awakened and extubated and taken to recovery room in good condition where he was placed on a continuous infusion of 5 mcg/cc epidural Fentanyl and 0.016% Marcaine at an initial rate of 15 cc/hr.   He will be evaluated over  the next 48 hours for adequacy of pain control or until anticoagulant therapy has been initiated.  DD:  01/15/99 TD:  01/16/99 Job: [id number]

## 2010-08-01 ENCOUNTER — Other Ambulatory Visit: Payer: Self-pay | Admitting: Gastroenterology

## 2010-09-04 ENCOUNTER — Ambulatory Visit: Payer: Self-pay | Admitting: Cardiology

## 2010-09-04 ENCOUNTER — Encounter: Payer: Self-pay | Admitting: Cardiology

## 2010-09-04 VITALS — BP 112/74 | HR 64 | Ht 72.0 in | Wt 151.3 lb

## 2010-09-04 DIAGNOSIS — I2510 Atherosclerotic heart disease of native coronary artery without angina pectoris: Secondary | ICD-10-CM

## 2010-09-04 NOTE — Progress Notes (Signed)
Subjective:       Terry Grant is a 65 y.o. male who presents for follow-up evaluation.       Feels well, here for routine followup    Denies cardiac symptoms.     Lipids last checked 1.5 years:     TC 150   HDL 90   LDL 47   TG 40s       No past medical history on file.      Current Outpatient Prescriptions   Medication Sig   . Omega-3 Fatty Acids (FISH OIL) 1200 MG CAPS 1,200 mg daily       . metoprolol (TOPROL-XL) 25 MG 24 hr tablet TAKE 0.5 TABLET DAILY   . Magnesium 250 MG TABS    . rosuvastatin (CRESTOR) 10 MG tablet TAKE 1 TABLET DAILY.   Marland Kitchen clopidogrel (PLAVIX) 75 MG tablet TAKE 1 TABLET DAILY.   . B Complex Vitamins (VITAMIN B COMPLEX) capsule TAKE 1 CAPSULE DAILY.   Marland Kitchen Cholecalciferol (VITAMIN D) 2000 UNITS CAPS TAKE 1 CAPSULE DAILY       Review of Systems  Constitutional: Appetite good, no fevers, night sweats or weight loss  HEENT: Unremarkable   Breast: No mass, tenderness or discharge.   Skin: Unremarkable.   CV: See HPI. No chest pain, shortness of breath or peripheral edema  Endocrine: No thyroid disease or diabetes mellitus.   Allergy / Hematology / Rheumatology: NKDA. No dry mouth, dry eyes.  Epistaxis in [place].   Respiratory: No cough, wheezing or dyspnea  GI: No GERD. No nausea/vomiting, or change in bowel habits. Does note abdominal pains in lower abdomen and back. Groins pains, no hernia.   GU: No dysuria, urgency or incontinence; Hematuria gross 2 mos ago.   Musculoskeletal: No bony or muscular tenderness.   Neuro: No MS changes, no motor weakness, no sensory changes   Psychiatric: No mood or thought issues.        Objective:     Blood pressure 112/74, pulse 64, height 1.829 m (6'), weight 68.629 kg (151 lb 4.8 oz).         Physical Exam  General: Appears well. No acute distress  Neck: Trachea midline, no stridor. No thyroid enlargement or nodularity is noted. Jugular venous pressure less than 7 cm   Carotid upstrokes are normal without bruit.  Respiratory The lungs are clear to percussion  and auscultation.   COR: Normal precordium and PMI. Normal S1 and S2. No murmur, rub, or gallop.  Abdominal: Normal without organomegaly, mass or tenderness. No evidence of abdominal aneurysm or bruit is noted.   Peripheral Vascular: 3 - 4+ brachial, radial, femoral, popliteal, posterior tibial and dorsalis pedis pulses are noted.  Capillary refill is within 2 seconds bilaterally. No evidence of venous insufficiency.   Extremities: No clubbing, cyanosis, or edema.   Skin: Normal  Neurological: Grossly normal motor, sensory, cerebellar, gait and cranial nerve function.    Cardiographics  ECG:     Lab Review         Assessment:      65 y.o.    Stable from CV standpoing on current Rx.      Patient Active Problem List   Diagnoses Code   . Supraventricular Tachycardia 427.89   . CAD (coronary artery disease),Multiple stents RCA Nov 2009 [facility]  414.00   . Coronary Artery Disease 414.00   . Dyslipidemia 272.4     He did have hematuria and should have GU followup with urologist.  Plan:     1.  Continue with current medical therapy  2.  Balanced nutrient, low salt diet discussed and reviewed.  3.  Daily exercise for 60 minutes consisting of both aerobic and isometric training  4.  Report any changed or new symptoms promptly to this office or to PCP.   I have advised the patient speak to you about a urology referral.   5.  Follow-up PCP for comprehensive primary care as scheduled.  6.  Pt will obtain Lipid panel, Metabolic Profile, HbA1C, 2 weeks prior to return to clinic.   7.  Return to clinic in 12 months.

## 2010-11-13 ENCOUNTER — Telehealth: Payer: Self-pay | Admitting: Cardiology

## 2010-11-13 NOTE — Telephone Encounter (Signed)
Patient calls wondering about need to hold plavix and questions need for antibiotic prophylaxis prior to dental implant.   No need for antibiotic prophylaxis.   Hold Plavix for 5 days - 7 days before implant, restart as soon as practical within 24 hrs of procedure. If the dentist is satisfied with 3 day holding of plavix, that is fine.     With this note, I will ask my assistant San Morelle to relay this information to Terry Grant at Dr. Danae Orleans office (Provider) [phone].     The patient asks on the phone about restarting ASA now that hematuria is resolved. ASA may be restarted, and if bleeding recurs discontinuation of ASA and continuation of Plavix is recommended.     RGS

## 2010-11-13 NOTE — Telephone Encounter (Signed)
patient is scheduled for tooth extraction and implantation and they would like to know if patient can discontinue Plavix for three days and if the patient requires antibiotic prophylasis?

## 2010-11-29 LAB — POCT HEMOGLOBIN-HEMACUE: Hemoglobin: 15.1

## 2010-12-11 ENCOUNTER — Other Ambulatory Visit: Payer: Self-pay | Admitting: Cardiology

## 2010-12-11 MED ORDER — METOPROLOL SUCCINATE 25 MG PO TB24 *I*
12.5000 mg | ORAL_TABLET | Freq: Every day | ORAL | Status: DC
Start: 2010-12-11 — End: 2010-12-30

## 2010-12-30 ENCOUNTER — Other Ambulatory Visit: Payer: Self-pay | Admitting: Cardiology

## 2010-12-30 MED ORDER — METOPROLOL SUCCINATE 25 MG PO TB24 *I*
12.5000 mg | ORAL_TABLET | Freq: Every day | ORAL | Status: DC
Start: 2010-12-30 — End: 2011-12-23

## 2011-04-16 ENCOUNTER — Encounter: Payer: Self-pay | Admitting: Gastroenterology

## 2011-07-29 ENCOUNTER — Other Ambulatory Visit: Payer: Self-pay | Admitting: Gastroenterology

## 2011-09-05 ENCOUNTER — Ambulatory Visit: Payer: Self-pay | Admitting: Cardiology

## 2011-11-21 ENCOUNTER — Ambulatory Visit: Payer: Self-pay | Admitting: Cardiology

## 2011-11-25 ENCOUNTER — Ambulatory Visit: Payer: Self-pay | Admitting: Cardiology

## 2011-12-23 ENCOUNTER — Ambulatory Visit: Payer: Self-pay | Admitting: Cardiology

## 2011-12-23 ENCOUNTER — Encounter: Payer: Self-pay | Admitting: Cardiology

## 2011-12-23 ENCOUNTER — Other Ambulatory Visit: Payer: Self-pay | Admitting: Gastroenterology

## 2011-12-23 VITALS — BP 105/64 | HR 55 | Ht 72.0 in | Wt 152.0 lb

## 2011-12-23 DIAGNOSIS — I498 Other specified cardiac arrhythmias: Secondary | ICD-10-CM

## 2011-12-23 DIAGNOSIS — I2510 Atherosclerotic heart disease of native coronary artery without angina pectoris: Secondary | ICD-10-CM

## 2011-12-23 MED ORDER — CLOPIDOGREL BISULFATE 75 MG PO TABS *I*
ORAL_TABLET | ORAL | Status: DC
Start: 2011-12-23 — End: 2016-12-16

## 2011-12-23 MED ORDER — METOPROLOL SUCCINATE 25 MG PO TB24 *I*
12.5000 mg | ORAL_TABLET | Freq: Every day | ORAL | Status: DC
Start: 2011-12-23 — End: 2015-05-08

## 2011-12-23 NOTE — Patient Instructions (Addendum)
Plan:     1.  Continue with current medical therapy,    Reduce Crestor from 10 mg to 5 mg daily, as you request.     2.  Lifestyle intervention counseling:       A. Diet: High fiber, balanced nutrient, low salt diet discussed and reviewed. Tell waiter/waitress you are on a low salt diet and to put dressing on side. Portion control, aided by eating slowly.  Drink 6 - 8 glasses of water daily to stay well hydrated.    Movie: [place] over [employer]    Book: Eat to Live, Monico Hoar MD    Current weight:   152 lb        B. Exercise: Daily exercise for 60 minutes consisting of both aerobic and isometric training      C. Smoking: Continue to avoid second hand smoke.       4.  Report any changed or new symptoms promptly to this office or to PCP.     5.  Follow-up PCP for comprehensive primary care as scheduled.    6.  Testing:    You will arrange for a Lipid panel, hs-CRP, Metabolic Profile, 2 weeks prior to return to clinic.    Bring results with you to next visit    ECG next visit     7.  Return to clinic in 12 months.

## 2011-12-23 NOTE — Progress Notes (Signed)
Dear Doctor Omelia Blackwater:     I was privileged to see your patient in consultation at the [facility] Cardiology Office at [facility] today.        Subjective:         Terry Grant is a 66 y.o. male who presents for follow-up evaluation.     Chief compliant:   Routine follow-up     Interval health history:     39M     Patient Active Problem List   Diagnosis Code   . Supraventricular Tachycardia 427.89   . CAD (coronary artery disease),Multiple stents RCA Nov 2009 [facility]  414.00   . Coronary Artery Disease 414.00   . Dyslipidemia 272.4     Endorses absence of chest pain, dyspnea, unusual fatigue, palpitations, dizziness, syncope, lower extremity edema.     Denies snoring. No history of sleep apnea. No daytime sleepiness.     Feels well,     Much discussion about risks and benefits of medications and diet, esp high fiber diet, Toprol XL, Crestor and Plavix. I have advised no changes in his medications. He prefers to reduce dose of Crestor of 5 mg daily.       Diet:   Portion control is an opportunity to improve diet.   Fish chicken, veggies avoids sat fats   Not a lot of salad   Avoids sugar and red meat     Caffeine: green tea   Etoh: occ wine     Exercise:    Resistance exercises and walks 30 - 60 min most days     Lipids:   04/17/2011   182/115/1.6/59/42     Blood glucose status:   Not diabetic     04/17/2011 A1C 5.4      Smoking:   Never       Current Outpatient Prescriptions   Medication Sig   . metoprolol (TOPROL-XL) 25 MG 24 hr tablet Take 0.5 tablets (12.5 mg total) by mouth daily     . Omega-3 Fatty Acids (FISH OIL) 1200 MG CAPS 1,200 mg daily       . Magnesium 250 MG TABS    . rosuvastatin (CRESTOR) 10 MG tablet TAKE 1 TABLET DAILY.   Marland Kitchen clopidogrel (PLAVIX) 75 MG tablet TAKE 1 TABLET DAILY.   . B Complex Vitamins (VITAMIN B COMPLEX) capsule TAKE 1 CAPSULE DAILY.   Marland Kitchen Cholecalciferol (VITAMIN D) 2000 UNITS CAPS TAKE 1 CAPSULE DAILY     No current facility-administered medications for this visit.      Review of Systems  Constitutional: Appetite good, no fevers, night sweats or weight loss  HEENT: Unremarkable   Breast: No mass, tenderness or discharge.   Respiratory: No cough, wheezing or dyspnea. Does not snore or have apneic episodes.   CV: See HPI. No chest pain, shortness of breath or peripheral edema  GI: No GERD. No nausea/vomiting, or change in bowel habits. Does note abdominal pains in lower abdomen and back. Groins pains, no hernia.   GU: No dysuria, urgency or incontinence;   Musculoskeletal: No bony or muscular tenderness.   Neuro: No MS changes, no motor weakness, no sensory changes   Endocrine: No thyroid disease or diabetes mellitus.   Allergy / Hematology / Rheumatology: NKDA. No dry mouth, dry eyes.    Psychiatric: No mood or thought issues.   Skin: Unremarkable.       No family history on file.       History     Social  History   . Marital Status: Single     Spouse Name: N/A     Number of Children: N/A   . Years of Education: N/A     Social History Main Topics   . Smoking status: Never Smoker    . Smokeless tobacco: None   . Alcohol Use: None   . Drug Use: None   . Sexually Active: None     Other Topics Concern   . None     Social History Narrative   . None            Objective:     Blood pressure 105/64, pulse 55, height 1.829 m (6'), weight 68.947 kg (152 lb).         Physical Exam  General: Appears well. No acute distress  Neck: Trachea midline, no stridor. No thyroid enlargement or nodularity is noted. Jugular venous pressure less than 7 cm   Carotid upstrokes are normal without bruit.  Respiratory The lungs are clear to percussion and auscultation.   COR: Normal precordium and PMI. Normal S1 and S2. No murmur, rub, or gallop.  Abdominal: Normal without organomegaly, mass or tenderness. No evidence of abdominal aneurysm or bruit is noted.   Peripheral Vascular: 3 - 4+ brachial, radial, femoral, popliteal, posterior tibial and dorsalis pedis pulses are noted.  Capillary refill is within 2  seconds bilaterally. No evidence of venous insufficiency.   Extremities: No clubbing, cyanosis, or edema.   Skin: Normal  Neurological: Grossly normal motor, sensory, cerebellar, gait and cranial nerve function.    Cardiographics  ECG: NSR 51 NSICVD     Lab Review      04/17/2011 Lipids   182/115/1.6/59/42     A1C 5.4     GFR 87        Assessment:      66 y.o.     Patient Active Problem List   Diagnosis Code   . Supraventricular Tachycardia 427.89   . CAD (coronary artery disease),Multiple stents RCA Nov 2009 [facility]  414.00   . Coronary Artery Disease 414.00   . Dyslipidemia 272.4        Active Visit diagnoses:     1. Supraventricular Tachycardia    2. CAD (coronary artery disease),Multiple stents RCA Nov 2009 [facility]     3. Coronary Artery Disease    4. 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 Lipids well controlled   BP well controlled   Much discussion about risks and benefits of medications and diet, esp high fiber diet, Toprol XL, Crestor and Plavix. I have advised no changes in his medications. He prefers to reduce dose of Crestor of 5 mg daily.       I appreciate the opportunity to participate in the care of your patient.     Please do not hesitate to contact me for any questions or concerns.           Plan:     1.  Continue with current medical therapy,    Reduce Crestor from 10 mg to 5 mg daily, as you request.     2.  Lifestyle intervention counseling:       A. Diet: High fiber, balanced nutrient, low salt diet discussed and reviewed. Tell waiter/waitress you are on a low salt  diet and to put dressing on side. Portion control, aided by eating slowly.  Drink 6 - 8 glasses of water daily to stay well hydrated.    Movie: [place] over [employer]    Book: Eat to Live, Monico Hoar MD    Current weight:   152 lb        B. Exercise: Daily exercise for 60 minutes consisting of both aerobic and isometric  training      C. Smoking: Continue to avoid second hand smoke.       4.  Report any changed or new symptoms promptly to this office or to PCP.     5.  Follow-up PCP for comprehensive primary care as scheduled.    6.  Testing:    You will arrange for a Lipid panel, hs-CRP, Metabolic Profile, 2 weeks prior to return to clinic.    Bring results with you to next visit    ECG next visit     7.  Return to clinic in 12 months.

## 2011-12-24 LAB — EKG 12-LEAD
P: 77 degrees
QRS: 82 degrees
Rate: 51 {beats}/min
Severity: ABNORMAL
Severity: ABNORMAL
T: 49 degrees

## 2012-08-06 ENCOUNTER — Other Ambulatory Visit: Payer: Self-pay | Admitting: Gastroenterology

## 2012-08-27 ENCOUNTER — Telehealth: Payer: Self-pay | Admitting: Cardiology

## 2012-08-27 ENCOUNTER — Other Ambulatory Visit: Payer: Self-pay | Admitting: Cardiology

## 2012-08-27 DIAGNOSIS — I2510 Atherosclerotic heart disease of native coronary artery without angina pectoris: Secondary | ICD-10-CM

## 2012-10-01 ENCOUNTER — Ambulatory Visit: Payer: Self-pay | Admitting: Cardiology

## 2012-10-01 ENCOUNTER — Encounter: Payer: Self-pay | Admitting: Cardiology

## 2012-10-01 VITALS — BP 107/66 | HR 55 | Ht 72.0 in | Wt 153.6 lb

## 2012-10-01 DIAGNOSIS — I2510 Atherosclerotic heart disease of native coronary artery without angina pectoris: Secondary | ICD-10-CM

## 2012-10-01 DIAGNOSIS — I498 Other specified cardiac arrhythmias: Secondary | ICD-10-CM

## 2012-10-01 DIAGNOSIS — E785 Hyperlipidemia, unspecified: Secondary | ICD-10-CM

## 2012-10-01 NOTE — Progress Notes (Signed)
Cardiology Attending Consultation Note    Dear Doctor Omelia Blackwater:     I was privileged to see your patient in consultation at the [facility] Cardiology Office at [facility] today.        Subjective:         Terry Grant is a 67 y.o. male who presents for follow-up evaluation.     Chief complaint:   Routine follow-up     Interval health history:     67 yo man     Patient Active Problem List   Diagnosis Code   . Hx APCs VPCs and one ventricular triplet by [facility] 2009  427.89   . CAD (coronary artery disease),Multiple stents RCA Nov 2009 [facility]  414.00   . Coronary Artery Disease 414.00   . Dyslipidemia 272.4     Lost weight - feels he eats properly and is exercising more. Excellent appetite.     Endorses absence of chest pain, nausea, dyspnea, unusual fatigue, palpitations, dizziness, syncope, lower extremity edema.     Denies snoring. No history of sleep apnea. No daytime sleepiness.       Diet:   Low fat diet fruits, egg whites, protein shake.   Caffeine: occ coffee tea   Etoh: rare     Exercise:    40 min aerobic + fast walk outdoors   Golf course   Resistance exercise     Lipids:   See below.     Blood glucose status:   See below.       Smoking:   Never       Current Outpatient Prescriptions   Medication Sig   . clopidogrel (PLAVIX) 75 MG tablet Take one pill daily.   . metoprolol (TOPROL-XL) 25 MG 24 hr tablet Take 0.5 tablets (12.5 mg total) by mouth daily   . Omega-3 Fatty Acids (FISH OIL) 1200 MG CAPS 1,200 mg daily       . Magnesium 250 MG TABS    . rosuvastatin (CRESTOR) 10 MG tablet TAKE 1 TABLET DAILY.   . B Complex Vitamins (VITAMIN B COMPLEX) capsule TAKE 1 CAPSULE DAILY.   Marland Kitchen Cholecalciferol (VITAMIN D) 2000 UNITS CAPS TAKE 1 CAPSULE DAILY     No current facility-administered medications for this visit.       Review of Systems  Constitutional: Appetite good, no fevers, night sweats or weight loss  HEENT: Unremarkable   Breast: No mass, tenderness or discharge.   Respiratory: No cough,  wheezing or dyspnea. Does not snore or have apneic episodes.   CV: See HPI. No chest pain, shortness of breath or peripheral edema  GI: No GERD. No nausea/vomiting, or change in bowel habits. Does note abdominal pains in lower abdomen and back. Groins pains, no hernia.   GU: BPH, nocturia.    Musculoskeletal: No bony or muscular tenderness.   Neuro: No MS changes, no motor weakness, no sensory changes   Endocrine: No thyroid disease or diabetes mellitus.   Allergy / Hematology / Rheumatology: NKDA. No dry mouth, dry eyes.    Psychiatric: No mood or thought issues.   Skin: Unremarkable.       Family History   Problem Relation Age of Onset   . Other Mother    . Heart surgery Father    . Heart attack Brother           History     Social History   . Marital Status: Single     Spouse Name: N/A  Number of Children: N/A   . Years of Education: N/A     Social History Main Topics   . Smoking status: Never Smoker    . Smokeless tobacco: None      Comment: Never Smoker    . Alcohol Use: None   . Drug Use: None   . Sexually Active: None     Other Topics Concern   . None     Social History Narrative   . None            Objective:     Blood pressure 107/66, pulse 55, height 1.829 m (6'), weight 69.673 kg (153 lb 9.6 oz).         Physical Exam  General: Appears well. No acute distress  Neck: Trachea midline, no stridor. No thyroid enlargement or nodularity is noted. Jugular venous pressure less than 7 cm   Carotid upstrokes are normal without bruit.  Respiratory The lungs are clear to percussion and auscultation.   COR: Normal precordium and PMI. Normal S1 and S2. No murmur, rub, or gallop.  Abdominal: Normal without organomegaly, mass or tenderness. No evidence of abdominal aneurysm or bruit is noted.   Peripheral Vascular: 3 - 4+ brachial, radial, femoral, popliteal, posterior tibial and dorsalis pedis pulses are noted.  Capillary refill is within 2 seconds bilaterally. No evidence of venous insufficiency.   Extremities: No  clubbing, cyanosis, or edema.   Skin: Normal  Neurological: Grossly normal motor, sensory, cerebellar, gait and cranial nerve function.    Cardiographics  12/25/2011: ECG: SINUS RHYTHM. NONSPECIFIC INTRAVENTRICULAR CONDUCTION DELAY * Since prior tracing heart rate is slower    Lab Review      Metabolic Profile Renal   No results found for this basename: NA, K, CL, CA, PO4, CO2, GLU, GFRC, GFRB    No results found for this basename: UN, CREAT, GLU, GFRC, GFRB      Lipids CBC   No results found for this basename: CHOL, HDL, CHHDC, LDLC, TRIG    No results found for this basename: HGB, HCT, PLT, WBC      Liver Endocrine   No results found for this basename: ast, alt, ck, bili    No results found for this basename: TSH, VID25     No results found for this basename: glu, macr, tpcreatratio                  Assessment:      68 y.o.     Patient Active Problem List   Diagnosis Code   . Hx APCs VPCs and one ventricular triplet by [facility] 2009  427.89   . CAD (coronary artery disease),Multiple stents RCA Nov 2009 [facility]  414.00   . Coronary Artery Disease 414.00   . Dyslipidemia 272.4        Active Visit diagnoses:     1. Hx APCs VPCs and one ventricular triplet by [facility] 2009     2. CAD (coronary artery disease),Multiple stents RCA Nov 2009 [facility]     3. Coronary Artery Disease    4. Dyslipidemia          Medical Decision Making:     67 yo man     CAD s/p Multiple stents     Overdue for lab data; drawn today at [facility] lab.     Has lost 9 lb since October 2013 visit     Needs to stay well hydrated.     Lipids - pending  from [facility] labs     I appreciate the opportunity to participate in the care of your patient.     Please do not hesitate to contact me for any questions or concerns.         Plan:     SIGN UP FOR MY CHART TO ACCESS YOUR MEDICAL RECORD      1. Continue with current medical therapy.      2.  Lifestyle intervention counseling:       A. Diet: High fiber, balanced nutrient, low salt diet discussed and  reviewed. DO NOT SKIP MEALS. Tell waiter/waitress you are on a low salt diet and to put dressing on side. Portion control, aided by eating slowly.  Drink 6 - 8 glasses of water daily to stay well hydrated. Read labels and avoid processed foods high in salt, sugar and fat.      Your Homework:   Movie: [place] over [employer]    Book: Eat to Live, Monico Hoar MD    Current weight:  143 lb        B. Exercise: Daily exercise for 60 minutes consisting of both aerobic and isometric training      C. Smoking: Continue to avoid second hand smoke.       4.  Report any changed or new symptoms promptly to this office or to PCP.     5.  Follow-up PCP for comprehensive primary care as scheduled.    6.  Testing:    Lipid panel, hs-CRP, Metabolic Profile, HbA1C, TSH: obtain 2 weeks prior to return to clinic.    ECG next visit     For test results, concerns, use MyChart; Alternatively, contact Tacy Learn, assistant to Dr. Joline Maxcy at [phone].     7.  Return to clinic in 1 year

## 2012-10-01 NOTE — Patient Instructions (Signed)
Plan:     SIGN UP FOR MY CHART TO ACCESS YOUR MEDICAL RECORD      1. Continue with current medical therapy.      2.  Lifestyle intervention counseling:       A. Diet: High fiber, balanced nutrient, low salt diet discussed and reviewed. DO NOT SKIP MEALS. Tell waiter/waitress you are on a low salt diet and to put dressing on side. Portion control, aided by eating slowly.  Drink 6 - 8 glasses of water daily to stay well hydrated. Read labels and avoid processed foods high in salt, sugar and fat.      Your Homework:   Movie: [place] over [employer]    Book: Eat to Live, Monico Hoar MD    Current weight:  143 lb        B. Exercise: Daily exercise for 60 minutes consisting of both aerobic and isometric training      C. Smoking: Continue to avoid second hand smoke.       4.  Report any changed or new symptoms promptly to this office or to PCP.     5.  Follow-up PCP for comprehensive primary care as scheduled.    6.  Testing:    Lipid panel, hs-CRP, Metabolic Profile, HbA1C, TSH: obtain 2 weeks prior to return to clinic.    ECG next visit     For test results, concerns, use MyChart; Alternatively, contact Tacy Learn, assistant to Dr. Joline Maxcy at [phone].     7.  Return to clinic in 1 year

## 2012-10-05 ENCOUNTER — Encounter: Payer: Self-pay | Admitting: Ophthalmology

## 2012-10-05 ENCOUNTER — Ambulatory Visit: Payer: Self-pay | Admitting: Ophthalmology

## 2012-10-05 VITALS — BP 112/66 | HR 60 | Ht 72.0 in | Wt 155.0 lb

## 2012-10-05 DIAGNOSIS — H35371 Puckering of macula, right eye: Secondary | ICD-10-CM

## 2012-10-05 DIAGNOSIS — H52229 Regular astigmatism, unspecified eye: Secondary | ICD-10-CM

## 2012-10-05 DIAGNOSIS — H33311 Horseshoe tear of retina without detachment, right eye: Secondary | ICD-10-CM

## 2012-10-05 DIAGNOSIS — H5210 Myopia, unspecified eye: Secondary | ICD-10-CM

## 2012-10-05 DIAGNOSIS — H259 Unspecified age-related cataract: Secondary | ICD-10-CM

## 2012-10-05 DIAGNOSIS — H527 Unspecified disorder of refraction: Secondary | ICD-10-CM

## 2012-10-05 DIAGNOSIS — H53141 Visual discomfort, right eye: Secondary | ICD-10-CM | POA: Insufficient documentation

## 2012-10-05 NOTE — Progress Notes (Signed)
Outpatient Visit      Patient name: Terry Grant  DOB: 1945/08/01       Age: 67 y.o.  MR#: [id number]    Encounter Date: 10/05/2012    Subjective:     Chief Complaint:   Chief Complaint   Patient presents with   . Other     Pt reported l"imited vision- blurry" in the right eye for the past 2 yrs.     HPI    Other Additional comments: Pt reported l"imited vision- blurry" in the   right eye for the past 2 yrs.        CommentsNPV referred for Cataract consultation right eye per Dr.   Hermelinda Medicus- cardiologist  Hx: cataracts OD, ERM OD-  See retina specialist In [place]    Cc: Pt reported "limited vision- blurry" in the right eye for the past 2   yrs.Denies any pain,irritation or discomfort.    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    Had HST OD with laser barricade treated in [place].          No current outpatient prescriptions on file. (Ophthalmic)     Current Outpatient Prescriptions (Other)   Medication Sig Dispense Refill   . clopidogrel (PLAVIX) 75 MG tablet Take one pill daily.  90 tablet  6   . metoprolol (TOPROL-XL) 25 MG 24 hr tablet Take 0.5 tablets (12.5 mg total) by mouth daily  45 tablet  3   . Omega-3 Fatty Acids (FISH OIL) 1200 MG CAPS 1,200 mg daily           . Magnesium 250 MG TABS     0   . rosuvastatin (CRESTOR) 10 MG tablet TAKE 1 TABLET DAILY.    0   . B Complex Vitamins (VITAMIN B COMPLEX) capsule TAKE 1 CAPSULE DAILY.    0   . Cholecalciferol (VITAMIN D) 2000 UNITS CAPS TAKE 1 CAPSULE DAILY    0         is allergic to seasonal; no known drug allergy; and no known latex allergy.      Past Medical History   Diagnosis Date   . Supraventricular Tachycardia 10/06/2008     Created by Conversion    . Visual discomfort of right eye      blurry vision - 26yrs   . ERM OD (epiretinal membrane, right eye)         Past Surgical History   Procedure Laterality Date   . Converted  procedure       Colon Surgery Conversion Data       History   Smoking status   . Never Smoker    Smokeless tobacco   . Not on file     Comment: Never Smoker       History   Alcohol Use: Not on file      History   Drug Use Not on file      Specialty Problems       Ophthalmology Problems    Epiretinal membrane, right eye        High myopia        Horseshoe retinal tear, right eye        Refractive error        Regular astigmatism        Senile cataract, unspecified  Visual discomfort of right eye               ROS    Positive for: Eyes ( visual discomfort right eye)    Negative for: Constitutional, Gastrointestinal, Neurological, Skin,   Genitourinary, Musculoskeletal, HENT, Endocrine, Cardiovascular,   Respiratory, Psychiatric, Allergic/Imm, Heme/Lymph      Objective:     Base Eye Exam       Visual Acuity    Right Left   Dist cc 20/150 20/20   Dist ph cc 20/30-2    Near cc J10 J1+   Method: Snellen - Linear   Correction: Glasses   Tonometry    Right Left   Pressure 15 15   Method: Tonopen   Time: 2:44 PM   Pupils    Pupils Dark APD   Right PERRLA 3mm None   Left PERRLA 3mm None   Visual Fields    Left Right   Result Full Full   Extraocular Movement    Right Left   Result Full, Ortho Full, Ortho   Neuro/Psych   Oriented x3: Yes   Mood/Affect: Normal   Dilation   Right eye: 2.5% Phenylephrine, 1.0% Tropicamide @ 2:44 PM    Comments: Deferred dilation of left eye      Additional Tests       Potential Acuity Meter    Right Left   PAM 20/200     Comments: LIF: 20/30      Slit Lamp and Fundus Exam       External Exam    Right Left   External Normal ocular adnexae, lacrimal gland & drainage, orbits Normal ocular adnexae, lacrimal gland & drainage, orbits   Slit Lamp Exam    Right Left   Lids/Lashes Normal structure & position Normal structure & position   Conjunctiva/Sclera Normal bulbar/palpebral, conjunctiva, sclera Normal bulbar/palpebral, conjunctiva, sclera   Cornea Normal epithelium, stroma, endothelium, tear  film, no guttae Normal epithelium, stroma, endothelium, tear film   Anterior Chamber Clear & deep Clear & deep   Iris Normal shape, size, morphology, good dilation Normal shape, size, morphology   Lens 2-3+ NS, central nuclear density 1+ NS   Vitreous Clear Clear   Fundus Exam    Right Left   Disc myopic Normal size, appearance, nerve fiber layer   C/D Ratio 0.35    Macula ERM Normal   Vessels Normal Normal   Periphery HST with laser 7 pm.  No RD Normal      Refraction       Wearing Rx    Sphere Cylinder Axis Add   Right -7.75 +1.75 175 +2.50   Left -6.25 +1.50 005 +2.50   Age: 57yrs   Type: PAL   Manifest Refraction    Sphere Cylinder Axis Dist Add Near   Right -9.00 +1.75 175 20/60 +2.50 J5   Left -6.25 +1.50 005 20/20 +2.50 J1+                No annotated images are attached to the encounter.      Assessment/Plan:      Assessment:   1. Epiretinal membrane, right eye    2. Horseshoe retinal tear, right eye    3. Senile cataract, unspecified    4. Refractive error    5. High myopia    6. Regular astigmatism         Plan:   1) cataract, visually significant OD>OS  Nuclear sclerotic change likely accounts for the myopic  shift  Discussed options including updating Rx, monitoring, and surgery  Patient is leaning towards surgery OD  As patient is high myope, and left cataract is not visually significant, could reduce, but not eliminate myopia  Plan to aim -3.50  Discussed monofocal vs premium lens implants, advise against multifocal given ERM.  Can consider toric lens implant, but patient wishes to remain in glasses as it is part of his image post op  Discussed increased risk of retinal tear/detachment given high myopia and history of retinal tear.  Discussed possibility of limited visual acuity postop secondary to ERM (PAM/LIF today 20/30-20/200, suspect capable of 20/30)  Patient deferred OCT as reports had it recently done  Defers femtosecond technology  Would need to return for consent and IOL master -- Shanda Bumps to  contact to schedule with me    2) History of retinal tear OD  - s/p laser barricade  - retina attached  - may predispose to additional retinal problems with surgery    3) High myopia, with astigmatism  - also increases risk of retinal tear/detachment  - please see information under cataract related post operative refraction    4) Epiretinal membrane, right eye  - Receives care in [place] for retinal problem (Dr. Fonnie Jarvis, [facility] [place])

## 2012-12-03 ENCOUNTER — Telehealth: Payer: Self-pay | Admitting: Cardiology

## 2012-12-16 ENCOUNTER — Telehealth: Payer: Self-pay | Admitting: Cardiology

## 2012-12-17 ENCOUNTER — Ambulatory Visit: Payer: Self-pay | Admitting: Cardiology

## 2013-08-16 ENCOUNTER — Other Ambulatory Visit: Payer: Self-pay | Admitting: Gastroenterology

## 2013-08-19 DIAGNOSIS — Z79899 Other long term (current) drug therapy: Secondary | ICD-10-CM | POA: Insufficient documentation

## 2013-09-23 ENCOUNTER — Ambulatory Visit: Payer: Self-pay | Admitting: Cardiology

## 2013-11-02 ENCOUNTER — Encounter: Payer: Self-pay | Admitting: Gastroenterology

## 2013-11-04 DIAGNOSIS — H269 Unspecified cataract: Secondary | ICD-10-CM | POA: Insufficient documentation

## 2013-11-09 ENCOUNTER — Ambulatory Visit: Payer: Self-pay | Admitting: Cardiology

## 2013-12-30 ENCOUNTER — Other Ambulatory Visit: Payer: Self-pay | Admitting: Gastroenterology

## 2013-12-30 ENCOUNTER — Ambulatory Visit: Payer: Self-pay | Admitting: Cardiology

## 2013-12-30 ENCOUNTER — Encounter: Payer: Self-pay | Admitting: Cardiology

## 2013-12-30 VITALS — BP 108/67 | HR 58 | Ht 72.0 in | Wt 147.0 lb

## 2013-12-30 DIAGNOSIS — I498 Other specified cardiac arrhythmias: Secondary | ICD-10-CM

## 2013-12-30 DIAGNOSIS — E785 Hyperlipidemia, unspecified: Secondary | ICD-10-CM

## 2013-12-30 DIAGNOSIS — I2510 Atherosclerotic heart disease of native coronary artery without angina pectoris: Secondary | ICD-10-CM

## 2013-12-30 MED ORDER — ROSUVASTATIN CALCIUM 20 MG PO TABS *I*
20.0000 mg | ORAL_TABLET | Freq: Every day | ORAL | Status: DC
Start: 2013-12-30 — End: 2015-02-16

## 2013-12-30 NOTE — Patient Instructions (Signed)
Plan:     Recommendation Summary:     High fiber, balanced nutrient (1/3 calories each of complex carbohydrate, protein and heart healthful fats), heart healthful diet reviewed in detail.     Increase Crestor from 10 to 20 mg every day     Follow-up with your PCP Dr.  Omelia BlackwaterLuther     Return to clinic in one year     Detailed Recommendations:     SIGN UP FOR MY CHART TO ACCESS YOUR MEDICAL RECORD     1. Medical therapy: See recommendations above.        2. Lifestyle intervention counseling:     Philosophy: Heart healthful eating is more expensive and time consuming, and you are worth it!  Do it for yourself, your family and friends, and your business or profession; for all the people, things and ideals you value.     Short term benefits:   Try it out for two weeks. You will overcome food cravings within 3 - 4 days. You will feel better quickly and have more energy if you adhere to the diet.     Weight loss: Target 1/2 to 1 pound weight loss per week. Not more, not less.     Strategy: (1) Fill up on fiber first (dark colored, non-starch veggies and fruit).  (2) Wait several minutes until you are not hungry. (3) Then select the calorie dense foods you enjoy and have them in small quantities, and eat them slowly.     Caution: Do not start with calorie dense foods! You will likely overshoot your caloric limits.     Remember: FIBER FIRST prevents caloric over shoot.     A. Dietary approach:     High fiber, balanced nutrient (1/3 of calories for each nutrient type: fats, proteins and carbohydrates), low salt diet using fresh or frozen foods.     Dietary salt: Avoid canned foods, processed foods, fast foods.      DO NOT SKIP MEALS. Skipping meals gives a feeling of entitlement to eat more later, and is a common observation in busy obese people.     Tell waiter/waitress you are on a low salt diet and to put dressing on side. Portion control, aided by eating slowly.  Drink 6 - 8 glasses of water daily to stay well hydrated. Read  labels and avoid processed foods high in salt, sugar and fat.      Limit alcohol 0 - 1 max per day. Alcohol is calorie dense and drinking alcohol adds "empty calories" and causes high blood pressure. Pure alcohol is highly toxic to heart, liver, and brain.     Snacks: Raisins, grapes, nuts (almonds, cashews, wallnuts, pecans). Celery sticks and baby carrots are delicious in between meal snacks if you get hungry. Put these snacks in small zip-lock bags and bring with you.      Consider making your own salad the night before and bringing with you to work so you can control what you eat.     Specifics:     Diet:     Breakfast: Use electric blender (NutriBullet, etc.) to prepare high fiber smoothies for breakfast every day: greens (kale, spinach, field greens), water, ice chips, half banana, carrots, blueberries, strawberries, cherries (fresh or frozen, get rid of pits!) 2 tbsp flax seed powder, sliced or raw almonds.     OR     High fiber cereal with sliced banana or berries with reduced fat (skim - 2%) milk, soy milk or almond  milk.     Lunch: Salads with nuts (raw walnuts, almonds, pecans, cashews, sesame) with balsamic vinaigrette.      Dinner: First course: 2 - 3 steamed or fresh non-starch dark colored veggies +/- salad. Second course: 4 oz (smaller than your palm) piece of red meat, fish, chicken or tofu.     -----------------------------------------------------------------------------------------------------------   Homework:   Movie: Forks over [employer] (free from Programmer, systemspublic library or download from [employer]).    Book: Eat to Live, Monico HoarJoel Fuhrman MD     [place] Program for Nutrition in Medicine Web resource:   Go to www.URNutritionInMedicine and sign up for email announcements; consider lifestyle programs or consultations for diet and lifestyle prevention and treatment of heart disease.  -----------------------------------------------------------------------------------------------------------     Define and monitor:     Current weight:  147  lb        B. Exercise: Daily exercise or every other day for 30 - 60 minutes consisting of both aerobic and isometric training      C. Smoking: Do not smoke; Avoid second hand smoke. Seek medical assistance and counseling if you smoke.     3.  Report any changed or new symptoms promptly to this office or to PCP.     4.  Follow-up PCP for comprehensive primary care as scheduled.    5.  Testing (PLEASE GO TO ANY STRONG LAB TO ENSURE LAB RESULTS ARE PROMPTLY REPORTED TO DR. Hermelinda MedicusSCHWARTZ):      See recomendations above.     For test results, concerns, use MyChart; Alternatively, contact Tacy LearnAmy Gregory, assistant to Dr. Joline Maxcyonald Mylin Hirano at [phone].       Macky Loweronald G. Hermelinda MedicusSchwartz MD MS [facility]FACC FAHA ABNM [facility]ASNC   Cardiology Attending   Professor of Medicine and Imaging Sciences   Director of Nuclear Cardiology and Cardiac PET CT   [facility]   [place], [place]

## 2013-12-30 NOTE — Progress Notes (Signed)
Cardiology Attending Consultation Note    Dear Doctor Omelia Blackwater:     I was privileged to see your patient in consultation at the [facility] Cardiology Office at [facility] today.        Subjective:       Terry Grant is a 68 y.o. male who presents for follow-up evaluation.     Chief complaint:   Routine follow-up     Interval health history:     68 y.o.    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 Has had elevated PSA after DRE. Has had hematuria.     Wondering about asa vs plavix     Endorses absence of chest pain, nausea, dyspnea, cough, unusual fatigue, palpitations, dizziness, syncope, claudication, lower extremity edema.     Denies snoring. No history of sleep apnea. No daytime sleepiness.     Diet:   Portion control is an opportunity to improve diet.   Caffeine: green tea in AM   Etoh: seldom     Exercise:    At least 40 min weights and resistance exercises and 30 min cardio     Lipids:   See below.     Blood glucose status:   See below.       Smoking:   Never smoker; counseled to avoid second hand smoke.         Current Outpatient Prescriptions   Medication Sig Note    ciprofloxacin (CIPRO) 500 MG tablet Take 500 mg by mouth 2 times daily  12/30/2013: Received from: External Pharmacy    aspirin 81 MG tablet Take 81 mg by mouth daily     metoprolol (TOPROL-XL) 25 MG 24 hr tablet Take 0.5 tablets (12.5 mg total) by mouth daily     rosuvastatin (CRESTOR) 10 MG tablet TAKE 1 TABLET DAILY.     B  Complex Vitamins (VITAMIN B COMPLEX) capsule TAKE 1 CAPSULE DAILY.     Cholecalciferol (VITAMIN D) 2000 UNITS CAPS TAKE 1 CAPSULE DAILY     clopidogrel (PLAVIX) 75 MG tablet Take one pill daily.     Magnesium 250 MG TABS       No current facility-administered medications for this visit.       Review of Systems  Constitutional: Appetite good, no fevers, night sweats or weight loss  HEENT: Unremarkable   Breast: No mass, tenderness or discharge.   Respiratory: No cough, wheezing or dyspnea. Does not snore or have apneic episodes.   CV: See HPI. No chest pain, shortness of breath or peripheral edema  GI: No GERD. No nausea/vomiting, or change in bowel habits. Does note abdominal pains in lower abdomen and back. Groins pains, no hernia.   GU: Hematuroa, elevated PSA prostate status currently under investigaton   Musculoskeletal: No bony or muscular tenderness.   Neuro: No MS changes, no motor weakness, no sensory changes   Endocrine: No  thyroid disease or diabetes mellitus.   Allergy / Hematology / Rheumatology: NKDA. No dry mouth, dry eyes.    Psychiatric: No mood or thought issues.   Skin: Unremarkable.       Family History   Problem Relation Age of Onset    Other Mother     Heart surgery Father     Heart attack Brother           History     Social History    Marital Status: Single     Spouse Name: N/A     Number of Children: N/A    Years of Education: N/A     Social History Main Topics    Smoking status: Never Smoker     Smokeless tobacco: None      Comment: Never Smoker     Alcohol Use: None    Drug Use: None    Sexual Activity: None     Other Topics Concern    None     Social History Narrative        Objective:     Blood pressure 108/67, pulse 58, height 1.829 m (6'), weight 66.679 kg (147 lb).     Physical Exam  General: Appears well. No acute distress  Neck: Trachea midline, no stridor. No thyroid enlargement or nodularity is noted. Jugular venous pressure less than 7 cm   Carotid upstrokes are  normal without bruit.  Respiratory The lungs are clear to percussion and auscultation.   COR: Normal precordium and PMI. Normal S1 and S2. No murmur, rub, or gallop.  Abdominal: Normal without organomegaly, mass or tenderness. No evidence of abdominal aneurysm or bruit is noted.   Peripheral Vascular: 3 - 4+ brachial, radial, femoral, popliteal, posterior tibial and dorsalis pedis pulses are noted.  Capillary refill is within 2 seconds bilaterally. No evidence of venous insufficiency.   Extremities: No clubbing, cyanosis, or edema. No calf tenderness. No Homan's sign present.   Skin: Normal  Neurological: Grossly normal motor, sensory, cerebellar, gait and cranial nerve function.    Cardiographics  ECG: NSR 61 IRBBB     Lab Review      Metabolic Profile Renal   No results found for: NA, K, CL, CA, PO4, CO2, GLU, GFRC, GFRB No results found for: UN, CREAT, GLU, GFRC, GFRB   Lipids CBC   No results found for: CHOL, HDL, CHHDC, LDLC, TRIG No results found for: HGB, HCT, PLT, WBC   Liver Endocrine   No results found for: AST, ALT, CK No results found for: TSH, VID25  No results found for: GLU, MACR, TPCREATRATIO          ~11/03/2013   K+ 4.1   TSH 2.12   hsCRP 0.5   Lipids 189 / 106 / / 76 / 37        Assessment:      68 y.o.        Active Visit diagnoses:     1. [facility]      2. CAD (coronary artery disease),Multiple stents RCA Nov 2009 [facility]      3. Dyslipidemia           Medical Decision Making:     68 yo man     Stable CAD s/p PCI RCA 2009 [facility]     No ectopy, Stbale ECG     On crestor 10; per 2013 [facility] AHA guidelines for patient with CAD,  will increase to 20 mg every day     RTC 1 year     I appreciate the opportunity to participate in the care of your patient.     Please do not hesitate to contact me for any questions or concerns.         Plan:     Recommendation Summary:     High fiber, balanced nutrient (1/3 calories each of complex carbohydrate,  protein and heart healthful fats), heart healthful diet reviewed in detail.     Increase Crestor from 10 to 20 mg every day     Follow-up with your PCP Dr.  Omelia Blackwater     Return to clinic in one year     Detailed Recommendations:     SIGN UP FOR MY CHART TO ACCESS YOUR MEDICAL RECORD     1. Medical therapy: See recommendations above.        2. Lifestyle intervention counseling:     Philosophy: Heart healthful eating is more expensive and time consuming, and you are worth it!  Do it for yourself, your family and friends, and your business or profession; for all the people, things and ideals you value.     Short term benefits:   Try it out for two weeks. You will overcome food cravings within 3 - 4 days. You will feel better quickly and have more energy if you adhere to the diet.     Weight loss: Target 1/2 to 1 pound weight loss per week. Not more, not less.     Strategy: (1) Fill up on fiber first (dark colored, non-starch veggies and fruit).  (2) Wait several minutes until you are not hungry. (3) Then select the calorie dense foods you enjoy and have them in small quantities, and eat them slowly.     Caution: Do not start with calorie dense foods! You will likely overshoot your caloric limits.     Remember: FIBER FIRST prevents caloric over shoot.     A. Dietary approach:     High fiber, balanced nutrient (1/3 of calories for each nutrient type: fats, proteins and carbohydrates), low salt diet using fresh or frozen foods.     Dietary salt: Avoid canned foods, processed foods, fast foods.      DO NOT SKIP MEALS. Skipping meals gives a feeling of entitlement to eat more later, and is a common observation in busy obese people.     Tell waiter/waitress you are on a low salt diet and to put dressing on side. Portion control, aided by eating slowly.  Drink 6 - 8 glasses of water daily to stay well hydrated. Read labels and avoid processed foods high in salt, sugar and fat.      Limit alcohol 0 - 1 max per day. Alcohol is  calorie dense and drinking alcohol adds "empty calories" and causes high blood pressure. Pure alcohol is highly toxic to heart, liver, and brain.     Snacks: Raisins, grapes, nuts (almonds, cashews, wallnuts, pecans). Celery sticks and baby carrots are delicious in between meal snacks if you get hungry. Put these snacks in small zip-lock bags and bring with you.      Consider making your own salad the night before and bringing with you to work so you can control what you eat.     Specifics:     Diet:     Breakfast: Use electric blender (NutriBullet, etc.) to prepare high fiber smoothies for breakfast every day: greens (kale, spinach,  field greens), water, ice chips, half banana, carrots, blueberries, strawberries, cherries (fresh or frozen, get rid of pits!) 2 tbsp flax seed powder, sliced or raw almonds.     OR     High fiber cereal with sliced banana or berries with reduced fat (skim - 2%) milk, soy milk or almond milk.     Lunch: Salads with nuts (raw walnuts, almonds, pecans, cashews, sesame) with balsamic vinaigrette.      Dinner: First course: 2 - 3 steamed or fresh non-starch dark colored veggies +/- salad. Second course: 4 oz (smaller than your palm) piece of red meat, fish, chicken or tofu.     -----------------------------------------------------------------------------------------------------------   Homework:   Movie: Forks over [employer] (free from Programmer, systemspublic library or download from [employer]).    Book: Eat to Live, Monico HoarJoel Fuhrman MD     [place] Program for Nutrition in Medicine Web resource:   Go to www.URNutritionInMedicine and sign up for email announcements; consider lifestyle programs or consultations for diet and lifestyle prevention and treatment of heart disease.  -----------------------------------------------------------------------------------------------------------     Define and monitor:    Current weight:  147  lb        B. Exercise: Daily exercise or every other day for 30 - 60 minutes consisting of  both aerobic and isometric training      C. Smoking: Do not smoke; Avoid second hand smoke. Seek medical assistance and counseling if you smoke.     3.  Report any changed or new symptoms promptly to this office or to PCP.     4.  Follow-up PCP for comprehensive primary care as scheduled.    5.  Testing (PLEASE GO TO ANY STRONG LAB TO ENSURE LAB RESULTS ARE PROMPTLY REPORTED TO DR. Hermelinda MedicusSCHWARTZ):      See recomendations above.     For test results, concerns, use MyChart; Alternatively, contact Tacy LearnAmy Gregory, assistant to Dr. Joline Maxcyonald Zaccheus Edmister at [phone].       Macky Loweronald G. Hermelinda MedicusSchwartz MD MS [facility] HospitalFACC FAHA ABNM [facility]ASNC   Cardiology Attending   Professor of Medicine and Imaging Sciences   Director of Nuclear Cardiology and Cardiac PET CT   [facility]   [place], [place]

## 2014-01-03 LAB — EKG 12-LEAD
P: 79 degrees
QRS: 80 degrees
Rate: 61 {beats}/min
Severity: ABNORMAL
Severity: ABNORMAL
T: 35 degrees

## 2014-08-15 ENCOUNTER — Other Ambulatory Visit: Payer: Self-pay | Admitting: Gastroenterology

## 2014-12-26 ENCOUNTER — Telehealth: Payer: Self-pay | Admitting: Cardiology

## 2014-12-26 NOTE — Telephone Encounter (Signed)
He will be seeing you in a couple weeks.  Please put lab orders in.

## 2014-12-27 ENCOUNTER — Other Ambulatory Visit: Payer: Self-pay | Admitting: Cardiology

## 2014-12-27 DIAGNOSIS — R079 Chest pain, unspecified: Secondary | ICD-10-CM

## 2014-12-27 DIAGNOSIS — Z9189 Other specified personal risk factors, not elsewhere classified: Secondary | ICD-10-CM

## 2014-12-28 ENCOUNTER — Ambulatory Visit: Payer: Self-pay | Admitting: Cardiology

## 2014-12-29 ENCOUNTER — Encounter: Payer: Self-pay | Admitting: Gastroenterology

## 2014-12-29 ENCOUNTER — Ambulatory Visit: Payer: Self-pay | Admitting: Cardiology

## 2015-01-13 ENCOUNTER — Ambulatory Visit: Payer: Self-pay | Admitting: Cardiology

## 2015-01-13 ENCOUNTER — Encounter: Payer: Self-pay | Admitting: Cardiology

## 2015-01-13 DIAGNOSIS — I2510 Atherosclerotic heart disease of native coronary artery without angina pectoris: Secondary | ICD-10-CM

## 2015-01-13 DIAGNOSIS — I498 Other specified cardiac arrhythmias: Secondary | ICD-10-CM

## 2015-01-13 DIAGNOSIS — Z9189 Other specified personal risk factors, not elsewhere classified: Secondary | ICD-10-CM

## 2015-01-13 DIAGNOSIS — E785 Hyperlipidemia, unspecified: Secondary | ICD-10-CM

## 2015-01-13 MED ORDER — ASPIRIN 81 MG PO TBEC *I*
81.0000 mg | DELAYED_RELEASE_TABLET | Freq: Every day | ORAL | 6 refills | Status: AC
Start: 2015-01-13 — End: ?

## 2015-01-13 NOTE — Patient Instructions (Addendum)
Plan:     Recommendation Summary:     Congratulations on your excellent exercise habit.   Congratulations on never smoking and avoiding second hand smoke.   Congratulations on taking all your medications.   Keep up the great work!     Our Goals Now:     Continue current medications.      You have indicated to me you have stopped clopidogrel and aspirin because of easy bleeding.   The MATCH Trial has demonstrated benefit of Plavix and bleeding risk of aspirin.   I have recommended Plavix (clopidogrel) to optimize antiplatelet therapy, and you have refused this medication.   You have indicated willingness to try aspirin 81 mg every other day.   I have discussed with you the fact that we do not have randomized controlled trial data on use of every other day ASA in treatment of ischemic heart disease, but it will likely provide better protection against recurrent coronary events than no aspirin.   I advise you to report any adverse reaction to aspirin every other day, or any other medication, to the prescribing MD rather than discontinue medication on your own without discussing the issue with the prescribing MD.    High fiber, balanced nutrient, heart healthful diet - reviewed in detail:    Eat fruits and dark colored, non-starch vegetables (avoid white potato, rice, corn; sweet potato is OK) - High fiber, balanced nutrient (1/3 calories each of complex carbohydrate, protein and heart healthful fats), heart healthful diet reviewed in detail.    12 nuts per day (4 per meal): almonds, walnuts and / or pecans to reduce blood sugar surges     Konsyl (pure psyllium vegetable fiber; NOT Metamucil [sugar + food coloring], NOT Citrucel [aspartame + food coloring] 1 tsp in cold glass water STIR for FULL 30-60 sec and drink within 1 -2 minutes minutes with repeat stirring in the morning to enhance hydration and improve fasting glucose and lipids.     Follow-up with your PCP Dr.  Omelia BlackwaterLuther     Lipid panel, hs-CRP, Metabolic  Profile, HbA1C, TSH: obtain 1 - 2 weeks prior to return to cardiology clinic in 12 months.     ECG next year.     Detailed Recommendations:     SIGN UP FOR MY CHART TO ACCESS YOUR MEDICAL RECORD     1. Medical therapy: See recommendations above.        2. Lifestyle intervention counseling:     Philosophy: Heart healthful eating is more expensive and time consuming, and you are worth it!  Do it for yourself, your family and friends, and your business or profession; for all the people, things and ideals you value.     Short term benefits:   Try it out for two weeks. You will overcome food cravings within 3 - 4 days. You will feel better quickly and have more energy if you adhere to the diet.     Weight loss: Target 1/2 to 1 pound weight loss per week. Not more, not less.     The body stores excess energy consumed as fat in adipose tissue.    1 pound of fat = 454 grams of fat x 9 cal/gram = ~4.000 calories   To lose 1/2 to 1 lb per week, consume 300-600 calories per day less than what you expend. Increase expenditure through exercise, and reduce intake through caloric intake reduction.     Strategy: (1) Fill up on fiber first (dark colored, non-starch  veggies and fruit).  (2) Wait several minutes until you are not hungry. (3) Then select the calorie dense foods you enjoy and have them in small quantities, and eat them slowly.     Caution: Do not start with calorie dense foods! You will likely overshoot your caloric limits.     Remember: FIBER FIRST prevents caloric over shoot.     A. Dietary approach:     High fiber, balanced nutrient (1/3 of calories for each nutrient type: fats, proteins and carbohydrates), low salt diet using fresh or frozen foods.     Dietary salt: Avoid canned foods, processed foods, fast foods.      DO NOT SKIP MEALS. Skipping meals gives a feeling of entitlement to eat more later, and is a common observation in busy obese people.     Tell waiter/waitress you are on a low salt diet and to put  dressing on side. Portion control, aided by eating slowly.  Drink 6 - 8 glasses of water daily to stay well hydrated. Read labels and avoid processed foods high in salt, sugar and fat.      Limit alcohol 0 - 1 max per day. Alcohol is calorie dense and drinking alcohol adds "empty calories" and causes high blood pressure. Pure alcohol is highly toxic to heart, liver, and brain.     Snacks: Raisins, grapes, nuts (almonds, cashews, wallnuts, pecans). Celery sticks and baby carrots are delicious in between meal snacks if you get hungry. Put these snacks in small zip-lock bags and bring with you.      Consider making your own salad the night before and bringing with you to work so you can control what you eat.     Specifics:     Diet:     Breakfast: Use electric blender (NutriBullet, etc.) to prepare high fiber smoothies for breakfast every day: greens (kale, spinach, field greens), water, ice chips, half banana, carrots, blueberries, strawberries, cherries (fresh or frozen, get rid of pits!) 2 tbsp flax seed powder, sliced or raw almonds.     OR     High fiber cereal with sliced banana or berries with reduced fat (skim - 2%) milk, soy milk or almond milk.     Lunch: Salads with nuts (raw walnuts, almonds, pecans, cashews, sesame) with balsamic vinaigrette.      Dinner: First course: 2 - 3 steamed or fresh non-starch dark colored veggies +/- salad. Second course: 4 oz (smaller than your palm) piece of red meat, fish, chicken or tofu.     -----------------------------------------------------------------------------------------------------------   Homework:   Movie: [place] over [employer] (free from Programmer, systems from [employer]).    Book: Eat to Live, Monico Hoar MD     UR Program for Nutrition in Medicine Web resource:   Go to www.URNutritionInMedicine and sign up for email announcements; consider lifestyle programs or consultations for diet and lifestyle prevention and treatment of heart  disease.  [email or web address]   -----------------------------------------------------------------------------------------------------------         B. Exercise: Daily exercise or every other day for 30 - 60 minutes consisting of both aerobic and isometric training      C. Smoking: Do not smoke; Avoid second hand smoke. Seek medical assistance and counseling if you smoke.     3.  Report any changed or new symptoms promptly to this office or to PCP.     4.  Follow-up PCP for comprehensive primary care as scheduled.    5.  Testing (PLEASE GO TO  ANY STRONG LAB TO ENSURE LAB RESULTS ARE PROMPTLY REPORTED TO DR. Tessa Lerner):      See recommendations above.     For test results, concerns, use MyChart; Alternatively, contact Mendel Ryder, assistant to Dr. Candie Chroman at [phone].       [place]man Nip Tessa Lerner MD [facility]   Cardiology Attending   Professor of Medicine and [place]   Director of Nuclear Cardiology and Cardiac PET [place] [facility]   [place]

## 2015-01-13 NOTE — Progress Notes (Addendum)
Cardiology Attending Consultation Note    Dear Doctor Omelia BlackwaterLuther:     I was privileged to see your patient in consultation at the [facility]          Subjective:       Terry SimpersJames E Grant is a 69 y.o. male who presents for follow-up evaluation.     Chief complaint:   Routine follow-up     Interval health history:     69 y.o.    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     Denies snoring. No history of sleep apnea. No daytime sleepiness.     Terry CrutchJames Grant has stopped his clopidogrel and aspirin because of easy bleeding, which he did not report to me nor did he admit to when I inquired about any new medical issues.   MATCH Trial has demonstrated benefit of Plavix and bleeding risk of aspirin.   After discussion, he refuses clopidogrel I have recommended.   He is willing to try aspirin 81 mg every other day.   I have advised him to report any adverse reaction to aspirin every other day rather than discontinue medication on his own without discussing the issue with MD.   I have discussed the fact that we do not have RCT data on use of every other day ASA in treatment of ischemic heart disease.     Diet:   Portion control is an opportunity to improve diet.   Fruits and veggies   Caffeine: green tea  2 cups   Etoh: rare     Define and monitor:    Current weight:  143  lb        Exercise:    30 min aerobic   40 min resistance   3-4 x per week     Lipids:   See below.     Blood glucose status:   See below.       Smoking:   Never smoker; counseled to avoid second hand smoke.       Current Outpatient Prescriptions   Medication Sig    TURMERIC PO Take by mouth    rosuvastatin (CRESTOR) 20 MG tablet Take 1 tablet (20 mg total) by mouth daily    metoprolol (TOPROL-XL) 25 MG 24 hr tablet Take 0.5 tablets (12.5 mg total) by mouth daily    Magnesium 250 MG TABS     B Complex Vitamins (VITAMIN B COMPLEX) capsule TAKE 1 CAPSULE DAILY.    Cholecalciferol (VITAMIN D) 2000 UNITS CAPS TAKE 1 CAPSULE DAILY    aspirin 81 MG tablet Take 81 mg by mouth daily    clopidogrel (PLAVIX) 75 MG tablet Take one pill daily.     No  current facility-administered medications for this visit.        Review of Systems  Constitutional: Appetite good, no fevers, night sweats or weight loss  HEENT: Unremarkable   Breast: No mass, tenderness or discharge.   Respiratory: No cough, wheezing or dyspnea. Does not snore or have apneic episodes.   CV: See HPI. No chest pain, shortness of breath or peripheral edema  GI: Hx increased bleeding on antiplatelet meds. No GERD. No nausea/vomiting, or change in bowel habits. Does note abdominal pains in lower abdomen and back. Groins pains, no hernia.   GU: No dysuria, urgency or incontinence;   Musculoskeletal: No bony or muscular tenderness.   Neuro: No MS changes, no motor weakness, no sensory changes   Endocrine: No thyroid disease or diabetes mellitus.   Allergy / Hematology / Rheumatology: NKDA. No dry mouth, dry eyes.    Psychiatric: No mood or thought issues.   Skin: Unremarkable.       Family History   Problem Relation Age of Onset    Other Mother     Heart surgery Father     Heart attack Brother         Social History     Social History    Marital status: Single     Spouse name: N/A     Number of children: N/A    Years of education: N/A     Social History Main Topics    Smoking status: Never Smoker    Smokeless tobacco: None      Comment: Never Smoker     Alcohol use None    Drug use: None    Sexual activity: Not Asked     Other Topics Concern    None     Social History Narrative          Objective:     Blood pressure 120/70, pulse 60, height 1.803 m ( ), weight 64.9 kg (143 lb).     Physical Exam  General: Appears well. No acute distress  Neck: Trachea midline, no stridor. No thyroid enlargement or nodularity is noted. Jugular venous pressure less than 7 cm   Carotid upstrokes are normal without bruit.  Respiratory The lungs are clear to percussion and auscultation.   COR: S1 and S2. No murmur, rub, or gallop.  Abdominal: Normal without organomegaly, mass or tenderness. No evidence of abdominal aneurysm or bruit is noted.   Peripheral Vascular: 3 - 4+ carotid, brachial, radial, popliteal, posterior tibial and dorsalis pedis pulses are noted.    Extremities: No clubbing, cyanosis, or edema. No calf tenderness. No Homan's sign present.   Neurological: Alert and oriented x 3. Grossly intact without apparent focal deficit.    Skin: No rash, erythema, lesion or scar noted.        Cardiographics  12/2013 ECG: SINUS RHYTHM . INCOMPLETE RIGHT BUNDLE BRANCH BLOCK    Lab Review      12/29/2014 [facility]     Hb 15.3 Hct 44 MCV 90   !BC 3.7 Plt 153    Glu 92     GFR > 90 , A1C 5.5     Lipids 174/89/2.0/74/56     hsCRP  1.4     TSH 1.94        Assessment:      69 y.o.        Active Visit diagnoses:     1. Other specified cardiac dysrhythmias     2. Coronary artery disease, angina presence unspecified, unspecified  vessel or lesion type, unspecified whether native or transplanted heart  aspirin 81 MG EC tablet   3. Atherosclerosis of coronary artery, angina presence unspecified, unspecified vessel or lesion type, unspecified whether native or transplanted heart     4. Dyslipidemia     5. Bleeding risk  due to combined clopidogrel + aspirin           Medical Decision Making:     70 yo man     # CAD s/p PCI RCA 2009 [facility]    Stable    No ectopy, [place] ECG    Because of prostate bleeding, he discontinued both ASA and Plavix.    Advised to restart ASA at 81 mg every other day     # Hx Bleeding on Antiplatelet Medications   Terry Grant has stopped his clopidogrel and aspirin because of easy bleeding, which he did not report to me nor did he admit to when I inquired about any new medical issues.   MATCH Trial has demonstrated benefit of Plavix and bleeding risk of aspirin.   After discussion, he refuses clopidogrel I have recommended.   He is willing to try aspirin 81 mg every other day.   I have advised him to report any adverse reaction to aspirin every other day rather than discontinue medication on his own without discussing the issue with MD.   I have discussed the fact that we do not have RCT data on use of every other day ASA in treatment of ischemic heart disease.     # Dyslipidemia    2013 ACC AHA guidelines for patient with CAD, increased to 20 mg every day    On crestor 20, favorable lipid panel       I appreciate the opportunity to participate in the care of your patient.     Please do not hesitate to contact me for any questions or concerns.         Plan:     Recommendation Summary:     Congratulations on your excellent exercise habit.   Congratulations on never smoking and avoiding second hand smoke.   Congratulations on taking all your medications.   Keep up the great work!     Our Goals Now:     Continue current medications.      You have indicated to me you have stopped clopidogrel and aspirin because of easy bleeding.   The MATCH Trial has demonstrated benefit of Plavix and bleeding risk of aspirin.   I have recommended Plavix (clopidogrel) to optimize antiplatelet therapy, and you have refused this medication.   You have indicated willingness to try aspirin 81 mg every other day.   I have  discussed with you the fact that we do not have randomized controlled trial data on use of every other day ASA in treatment of ischemic heart disease, but it will likely provide better protection against recurrent coronary events than no aspirin.   I advise you to report any adverse reaction to aspirin every other day, or any other medication, to the prescribing MD rather than discontinue medication on your own without discussing the issue with the prescribing MD.    High fiber, balanced nutrient, heart healthful diet - reviewed in detail:    Eat fruits and dark colored, non-starch vegetables (avoid white potato, rice, corn; sweet potato is OK) - High fiber, balanced nutrient (1/3 calories each of complex carbohydrate, protein and heart healthful fats), heart healthful diet reviewed in detail.  12 nuts per day (4 per meal): almonds, walnuts and / or pecans to reduce blood sugar surges     Konsyl (pure psyllium vegetable fiber; NOT Metamucil [sugar + food coloring], NOT Citrucel [aspartame + food coloring] 1 tsp in cold glass water STIR for FULL 30-60 sec and drink within 1 -2 minutes minutes with repeat stirring in the morning to enhance hydration and improve fasting glucose and lipids.     Follow-up with your PCP Dr.  Omelia Blackwater     Lipid panel, hs-CRP, Metabolic Profile, HbA1C, TSH: obtain 1 - 2 weeks prior to return to cardiology clinic in 12 months.     ECG next year.     Detailed Recommendations:     SIGN UP FOR MY CHART TO ACCESS YOUR MEDICAL RECORD     1. Medical therapy: See recommendations above.        2. Lifestyle intervention counseling:     Philosophy: Heart healthful eating is more expensive and time consuming, and you are worth it!  Do it for yourself, your family and friends, and your business or profession; for all the people, things and ideals you value.     Short term benefits:   Try it out for two weeks. You will overcome food cravings within 3 - 4 days. You will feel better quickly and have more  energy if you adhere to the diet.     Weight loss: Target 1/2 to 1 pound weight loss per week. Not more, not less.     The body stores excess energy consumed as fat in adipose tissue.    1 pound of fat = 454 grams of fat x 9 cal/gram = ~4.000 calories   To lose 1/2 to 1 lb per week, consume 300-600 calories per day less than what you expend. Increase expenditure through exercise, and reduce intake through caloric intake reduction.     Strategy: (1) Fill up on fiber first (dark colored, non-starch veggies and fruit).  (2) Wait several minutes until you are not hungry. (3) Then select the calorie dense foods you enjoy and have them in small quantities, and eat them slowly.     Caution: Do not start with calorie dense foods! You will likely overshoot your caloric limits.     Remember: FIBER FIRST prevents caloric over shoot.     A. Dietary approach:     High fiber, balanced nutrient (1/3 of calories for each nutrient type: fats, proteins and carbohydrates), low salt diet using fresh or frozen foods.     Dietary salt: Avoid canned foods, processed foods, fast foods.      DO NOT SKIP MEALS. Skipping meals gives a feeling of entitlement to eat more later, and is a common observation in busy obese people.     Tell waiter/waitress you are on a low salt diet and to put dressing on side. Portion control, aided by eating slowly.  Drink 6 - 8 glasses of water daily to stay well hydrated. Read labels and avoid processed foods high in salt, sugar and fat.      Limit alcohol 0 - 1 max per day. Alcohol is calorie dense and drinking alcohol adds "empty calories" and causes high blood pressure. Pure alcohol is highly toxic to heart, liver, and brain.     Snacks: Raisins, grapes, nuts (almonds, cashews, wallnuts, pecans). Celery sticks and baby carrots are delicious in between meal snacks if you get hungry. Put these snacks in small zip-lock bags and bring with you.  Consider making your own salad the night before and bringing  with you to work so you can control what you eat.     Specifics:     Diet:     Breakfast: Use electric blender (NutriBullet, etc.) to prepare high fiber smoothies for breakfast every day: greens (kale, spinach, field greens), water, ice chips, half banana, carrots, blueberries, strawberries, cherries (fresh or frozen, get rid of pits!) 2 tbsp flax seed powder, sliced or raw almonds.     OR     High fiber cereal with sliced banana or berries with reduced fat (skim - 2%) milk, soy milk or almond milk.     Lunch: Salads with nuts (raw walnuts, almonds, pecans, cashews, sesame) with balsamic vinaigrette.      Dinner: First course: 2 - 3 steamed or fresh non-starch dark colored veggies +/- salad. Second course: 4 oz (smaller than your palm) piece of red meat, fish, chicken or tofu.     -----------------------------------------------------------------------------------------------------------   Homework:   Movie: [place] over [employer] (free from Programmer, systems from [employer]).    Book: Eat to Live, Monico Hoar MD     UR Program for Nutrition in Medicine Web resource:   Go to www.URNutritionInMedicine and sign up for email announcements; consider lifestyle programs or consultations for diet and lifestyle prevention and treatment of heart disease.  [email or web address]   -----------------------------------------------------------------------------------------------------------         B. Exercise: Daily exercise or every other day for 30 - 60 minutes consisting of both aerobic and isometric training      C. Smoking: Do not smoke; Avoid second hand smoke. Seek medical assistance and counseling if you smoke.     3.  Report any changed or new symptoms promptly to this office or to PCP.     4.  Follow-up PCP for comprehensive primary care as scheduled.    5.  Testing (PLEASE GO TO ANY STRONG LAB TO ENSURE LAB RESULTS ARE PROMPTLY REPORTED TO DR.  Hermelinda Medicus):      See recommendations above.     For test results, concerns, use MyChart; Alternatively, contact Tacy Learn, assistant to Dr. Joline Maxcy at [phone].       Macky Lower Hermelinda Medicus MD MS [facility] FAHA ABNM [facility]   Cardiology Attending   Professor of Medicine and Imaging Sciences   Director of Nuclear Cardiology and Cardiac PET CT   [facility]   [place], [place]

## 2015-01-13 NOTE — Addendum Note (Signed)
Addended by: Morton StallSCHWARTZ, Hason Ofarrell G on: 01/13/2015 01:02 PM     Modules accepted: Orders

## 2015-01-14 DIAGNOSIS — Z9189 Other specified personal risk factors, not elsewhere classified: Secondary | ICD-10-CM | POA: Insufficient documentation

## 2015-01-24 ENCOUNTER — Ambulatory Visit (INDEPENDENT_AMBULATORY_CARE_PROVIDER_SITE_OTHER): Payer: Medicare Other | Admitting: Sports Medicine

## 2015-01-24 ENCOUNTER — Encounter: Payer: Self-pay | Admitting: Sports Medicine

## 2015-01-24 VITALS — BP 125/72 | Ht 68.0 in | Wt 190.0 lb

## 2015-01-24 DIAGNOSIS — M7742 Metatarsalgia, left foot: Secondary | ICD-10-CM

## 2015-01-24 DIAGNOSIS — M7741 Metatarsalgia, right foot: Secondary | ICD-10-CM

## 2015-01-24 NOTE — Progress Notes (Signed)
   Subjective:    Patient ID: Sheralyn BoatmanJames G Fodge, male    DOB: [birth year], 69 y.o.   MRN: [id number]  HPI  Mr. Manson PasseyBrown is a 69 year old male with past medical history of hyperlipidemia on Crestor and GERD on daily Protonix presenting for evaluation of metatarsalgia of the LEFT foot and sesamoiditis of the left foot. He was sent on referral from Dr. Thurston HoleWainer for custom orthotic development.  Patient with six-week history of plantar foot pain, most severe at the second and third metatarsal heads but also present on the first MTP joint. X-rays performed by referring orthopedist revealed sesamoiditis and metatarsalgia but no evidence of fracture. Metatarsal pads were placed by orthopedic surgeon and the patient reports he's been using these daily for approximately 7 days with significant improvement in his symptoms.  Presenting today for custom orthotics to wear during work and his primary exercise (golf).  Past medical history, past surgical history, medications and allergies were reviewed and updated in EMR. 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  BILATERAL foot exam: Pain to palpation in the LEFT lateral sesamoid and in the first webspace. Also tender to palpation along the second and third metatarsal heads. Range of motion of his toes and ankles are intact bilaterally and symmetric/appropriate. The patient has mild pes planus when seated that becomes more pronounced  with weightbearing. He had a negative talar tilt, negative anterior drawer and was nontender of her bony landmarks other than above. The patient had good posterior tibial function and increased pronation with weightbearing/standing.      Assessment & Plan:  Left foot pain secondary to sesamoiditis/metatarsalgia  Since the patient has done well with the metatarsal pads that Dr. Thurston HoleWainer gave him I have decided to make him a pair of custom dress orthotics with metatarsal pads in place. He may try these orthotics in his golf shoes as well but I would be happy to make him a second pair for his golf shoes if today's orthotics are not comfortable in them. Alternatively, we could also place a well cushioned metatarsal bar across his forefoot in the future if needed. He will follow-up with me as needed.  Total time spent with the patient was 40 minutes with greater than 50% of the time spent in face-to-face consultation discussing orthotic construction, instruction, and fitting. Patient found the orthotics to be comfortable prior to leaving the office.  Patient was fitted for a : standard, cushioned, semi-rigid orthotic. The orthotic was heated and afterward the patient stood on the orthotic blank positioned on the orthotic stand. The patient was positioned in subtalar neutral position and 10 degrees of ankle dorsiflexion in a weight bearing stance. After completion of molding, a stable base was applied to the orthotic blank. The blank was ground to a stable position for weight bearing. Size: 11 dress orthotic Base: blue EVA Posting: none Additional orthotic padding: b/l metatarsal pads

## 2015-01-26 ENCOUNTER — Encounter: Payer: Self-pay | Admitting: Sports Medicine

## 2015-02-16 ENCOUNTER — Other Ambulatory Visit: Payer: Self-pay | Admitting: Cardiology

## 2015-02-16 DIAGNOSIS — E785 Hyperlipidemia, unspecified: Secondary | ICD-10-CM

## 2015-05-08 ENCOUNTER — Other Ambulatory Visit: Payer: Self-pay | Admitting: Cardiology

## 2015-05-08 DIAGNOSIS — I2510 Atherosclerotic heart disease of native coronary artery without angina pectoris: Secondary | ICD-10-CM

## 2015-05-08 MED ORDER — METOPROLOL SUCCINATE 25 MG PO TB24 *I*
12.5000 mg | ORAL_TABLET | Freq: Every day | ORAL | 3 refills | Status: DC
Start: 2015-05-08 — End: 2016-01-18

## 2015-05-22 DIAGNOSIS — E7800 Pure hypercholesterolemia, unspecified: Secondary | ICD-10-CM | POA: Insufficient documentation

## 2015-05-22 DIAGNOSIS — M542 Cervicalgia: Secondary | ICD-10-CM | POA: Insufficient documentation

## 2015-05-22 DIAGNOSIS — J309 Allergic rhinitis, unspecified: Secondary | ICD-10-CM | POA: Insufficient documentation

## 2015-05-22 DIAGNOSIS — M1990 Unspecified osteoarthritis, unspecified site: Secondary | ICD-10-CM | POA: Insufficient documentation

## 2015-05-22 DIAGNOSIS — N451 Epididymitis: Secondary | ICD-10-CM | POA: Insufficient documentation

## 2015-05-22 DIAGNOSIS — K2270 Barrett's esophagus without dysplasia: Secondary | ICD-10-CM | POA: Diagnosis present

## 2015-05-22 DIAGNOSIS — Z9989 Dependence on other enabling machines and devices: Secondary | ICD-10-CM | POA: Insufficient documentation

## 2015-05-22 DIAGNOSIS — E559 Vitamin D deficiency, unspecified: Secondary | ICD-10-CM | POA: Insufficient documentation

## 2015-05-22 DIAGNOSIS — G4730 Sleep apnea, unspecified: Secondary | ICD-10-CM | POA: Insufficient documentation

## 2015-05-22 DIAGNOSIS — E291 Testicular hypofunction: Secondary | ICD-10-CM | POA: Insufficient documentation

## 2015-06-01 ENCOUNTER — Ambulatory Visit: Payer: Medicare Other | Admitting: Pediatrics

## 2015-06-14 ENCOUNTER — Other Ambulatory Visit: Payer: Self-pay | Admitting: Cardiology

## 2015-06-14 DIAGNOSIS — E785 Hyperlipidemia, unspecified: Secondary | ICD-10-CM

## 2015-06-14 MED ORDER — ROSUVASTATIN CALCIUM 20 MG PO TABS *I*
20.0000 mg | ORAL_TABLET | Freq: Every day | ORAL | 3 refills | Status: DC
Start: 2015-06-14 — End: 2016-06-11

## 2015-06-14 NOTE — Telephone Encounter (Signed)
Patient need refills for rosuvastatin.

## 2015-08-08 ENCOUNTER — Other Ambulatory Visit: Payer: Self-pay | Admitting: Gastroenterology

## 2015-12-18 ENCOUNTER — Other Ambulatory Visit: Payer: Self-pay | Admitting: Cardiology

## 2015-12-18 ENCOUNTER — Encounter: Payer: Self-pay | Admitting: Cardiology

## 2015-12-18 ENCOUNTER — Ambulatory Visit: Payer: Self-pay | Admitting: Cardiology

## 2015-12-18 DIAGNOSIS — I2510 Atherosclerotic heart disease of native coronary artery without angina pectoris: Secondary | ICD-10-CM

## 2015-12-18 DIAGNOSIS — E785 Hyperlipidemia, unspecified: Secondary | ICD-10-CM

## 2015-12-18 DIAGNOSIS — Z9189 Other specified personal risk factors, not elsewhere classified: Secondary | ICD-10-CM

## 2015-12-18 LAB — LIPID PANEL
Chol/HDL Ratio: 1.5
Cholesterol: 183 mg/dL
HDL: 124 mg/dL
LDL Calculated: 50 mg/dL
Non HDL Cholesterol: 59 mg/dL
Triglycerides: 44 mg/dL

## 2015-12-18 LAB — COMPREHENSIVE METABOLIC PANEL
ALT: 62 U/L — ABNORMAL HIGH (ref 0–50)
AST: 43 U/L (ref 0–50)
Albumin: 4.7 g/dL (ref 3.5–5.2)
Alk Phos: 65 U/L (ref 40–130)
Anion Gap: 19 — ABNORMAL HIGH (ref 7–16)
Bilirubin,Total: 0.5 mg/dL (ref 0.0–1.2)
CO2: 22 mmol/L (ref 20–28)
Calcium: 9.3 mg/dL (ref 8.6–10.2)
Chloride: 102 mmol/L (ref 96–108)
Creatinine: 0.95 mg/dL (ref 0.67–1.17)
GFR,Black: 93 *
GFR,Caucasian: 81 *
Glucose: 84 mg/dL (ref 60–99)
Lab: 20 mg/dL (ref 6–20)
Potassium: 4.4 mmol/L (ref 3.3–5.1)
Sodium: 143 mmol/L (ref 133–145)
Total Protein: 7.1 g/dL (ref 6.3–7.7)

## 2015-12-18 LAB — CBC AND DIFFERENTIAL
Baso # K/uL: 0 10*3/uL (ref 0.0–0.1)
Basophil %: 0.5 %
Eos # K/uL: 0.1 10*3/uL (ref 0.0–0.5)
Eosinophil %: 2.2 %
Hematocrit: 47 % (ref 40–51)
Hemoglobin: 15.6 g/dL (ref 13.7–17.5)
IMM Granulocytes #: 0 10*3/uL (ref 0.0–0.1)
IMM Granulocytes: 0.2 %
Lymph # K/uL: 0.8 10*3/uL — ABNORMAL LOW (ref 1.3–3.6)
Lymphocyte %: 14.5 %
MCH: 32 pg/cell (ref 26–32)
MCHC: 34 g/dL (ref 32–37)
MCV: 94 fL — ABNORMAL HIGH (ref 79–92)
Mono # K/uL: 0.4 10*3/uL (ref 0.3–0.8)
Monocyte %: 7.4 %
Neut # K/uL: 4.2 10*3/uL (ref 1.8–5.4)
Nucl RBC # K/uL: 0 10*3/uL (ref 0.0–0.0)
Nucl RBC %: 0 /100 WBC (ref 0.0–0.2)
Platelets: 177 10*3/uL (ref 150–330)
RBC: 5 MIL/uL (ref 4.6–6.1)
RDW: 12.6 % (ref 11.6–14.4)
Seg Neut %: 75.2 %
WBC: 5.6 10*3/uL (ref 4.2–9.1)

## 2015-12-18 LAB — TSH: TSH: 1.73 u[IU]/mL (ref 0.27–4.20)

## 2015-12-18 LAB — HIGH SENSITIVITY CRP: CRP,High Sensitivity: 0.6 mg/L

## 2015-12-18 NOTE — Patient Instructions (Signed)
.............................................................................................................       Plan:     Our goals now:   .............................................................................................................Marland Kitchen.    # Congratulations:    Not smoking    Physically active    Plant based diet    Weight control      # Continue current medications      # Lifestyle      Heart healthful balanced prudent diet    Weight: Body mass index is 19.53 kg/(m^2).    Current status and targets for weight management reviewed.      Exercise    Walk 6-10,000 steps every day year round    Isometric: Aim for 10 - 15 reps to achieve fatigue upper and lower body; rest and repeat. Do every other day.      # Testing    Lipid panel, hs-CRP, Metabolic Profile, HbA1C, TSH: obtain today.     # Follow-up:    Your primary care physician (PCP), as scheduled.      Cardiology: Return to clinic in 12 months.     Macky Loweronald G. Hermelinda MedicusSchwartz MD MS, [facility]   Cardiology Attending   Professor of Medicine and Imaging Sciences  [facility]     Questions, results? Sign up for MyChart - ask how at front desk.    Admin Assistant: Tacy LearnAmy Gregory tel: [phone]   ..............................................................................................................     Electronically signed on 12/18/2015 at 12:54 PM.

## 2015-12-18 NOTE — Addendum Note (Signed)
Addended by: Izora RibasMCCORMICK, Toshiro Hanken ROSE on: 12/18/2015 01:24 PM     Modules accepted: Orders

## 2015-12-18 NOTE — Progress Notes (Signed)
Cardiology Office Revisit Note    Date of Visit: 12/18/2015 Patient: Terry Grant   Patients PCP: Cindra EvesLuther, Ramesh, MD Patient DOB: 1945/05/18     Subjective/Reason For Visit     I had the pleasure of seeing Terry Grant in cardiology followup on 12/18/2015.     Brother 70 yo sudden cardiac death       Past Medical History:   Diagnosis Date    ERM OD (epiretinal membrane, right eye)     Supraventricular Tachycardia 10/06/2008    Created by Conversion     Visual discomfort of right eye     blurry vision - [age]yrs     Past Surgical History:   Procedure Laterality Date    CONVERTED PROCEDURE      Colon Surgery Conversion Data        ROS Review of Systems  Constitutional: Appetite good, no fevers, night sweats or weight loss  HEENT: Unremarkable   Breast: No mass, tenderness or discharge.   CV: See HPI. No chest pain, shortness of breath or peripheral edema  Respiratory: No cough, wheezing or dyspnea  GI: No GERD. No nausea/vomiting, or change in bowel habits. Does note abdominal pains in lower abdomen and back. Groins pains, no hernia.   GU: No dysuria, urgency or incontinence;   Musculoskeletal: No bony or muscular tenderness.   Neuro: No MS changes, no motor weakness, no sensory changes   Endocrine: No thyroid disease or diabetes mellitus.   Allergy / Hematology / Rheumatology: NKDA. No dry mouth, dry eyes.    Psychiatric: No mood or thought issues.   Skin: Unremarkable.     Medications     Current Outpatient Prescriptions   Medication Sig    rosuvastatin (CRESTOR) 20 MG tablet Take 1 tablet (20 mg total) by mouth daily    metoprolol (TOPROL-XL) 25 MG 24 hr tablet Take 0.5 tablets (12.5 mg total) by mouth daily    TURMERIC PO Take by mouth    aspirin 81 MG EC tablet Take 1 tablet (81 mg total) by mouth daily    clopidogrel (PLAVIX) 75 MG tablet Take one pill daily.    Magnesium 250 MG TABS     B Complex Vitamins (VITAMIN B COMPLEX) capsule TAKE 1 CAPSULE DAILY.    Cholecalciferol (VITAMIN D) 2000 UNITS CAPS  TAKE 1 CAPSULE DAILY     Vitals and Physical Exam     Terry Grant's  height is 1.829 m (6') and weight is 65.3 kg (144 lb). His blood pressure is 98/56 and his pulse is 52.  Body mass index is 19.53 kg/(m^2).    Physical Exam   Exam:     Visit Vitals    BP 98/56 (BP Location: Left arm, Patient Position: Sitting, Cuff Size: adult)    Pulse 52    Ht 1.829 m (6')    Wt 65.3 kg (144 lb)    BMI 19.53 kg/m2        Neck: No JVD, no carotid bruit   Lungs clear to P&A   Cor S1S2 without gallop, murmur   Pulses: intact 4+ pulses, symmetric    Abdomen: non-tender, no mass or organomegaly   Extremities: no cyanosis, clubbing edema, cellulitis, calf tenderness, or Homan's sign.    Neuro: A&Ox3 grossly normal     Laboratory Data     Hematology:   No results found for requested labs within last 365 days.     Chemistry:   No results found for requested labs  within last 365 days.     Coagulation Studies:   No results found for requested labs within last 365 days.     Cardiac:   No results found for requested labs within last 365 days.     Lipids:   No results found for requested labs within last 365 days.     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   - Advised to  restart ASA at 81 mg every other day   - On avodart, no further pain urinating     # Hx Bleeding on Antiplatelet Medications   Terry Grant has stopped his clopidogrel and aspirin because of easy bleeding, which he did not report to me nor did he admit to when I inquired about any new medical issues.   MATCH Trial has demonstrated benefit of Plavix and bleeding risk of aspirin.   After discussion, he refuses clopidogrel I have recommended.   Willing to take ASA 81 mg every other day.   I have advised him to report any adverse reaction to aspirin every other day rather than discontinue medication on his own without discussing the issue with MD.   I have discussed the fact that we do not have RCT data on use of every other day ASA in treatment of ischemic heart disease.     # Dyslipidemia   - 2013 ACC AHA guidelines for patient with CAD, increased to 20 mg every day    - On crestor 20, favorable lipid panel   - Repeat blood work today     I appreciate the opportunity to participate in the care of your patient.     Please do not hesitate to contact me for any questions or concerns.       ..............................................................................................................    Plan:     Our goals now:   .............................................................................................................Marland Kitchen    # Congratulations:    Not smoking    Physically active    Plant based diet    Weight control      # Continue current medications      # Lifestyle      Heart healthful balanced prudent diet    Weight: Body mass index is 19.53 kg/(m^2).    Current status and targets for weight management reviewed.      Exercise    Walk 6-10,000 steps every day year round    Isometric: Aim for 10 - 15 reps  to achieve fatigue upper and lower body; rest and repeat. Do every other day.      # Testing    Lipid panel, hs-CRP, Metabolic Profile, HbA1C, TSH: obtain today.     # Follow-up:     Your primary care physician (PCP), as scheduled.      Cardiology: Return to clinic in 12 months.     Macky Lower Hermelinda Medicus MD MS, [facility]   Cardiology Attending   Professor of Medicine and Imaging Sciences  [facility] Clinical Cardiology Group     Questions, results? Sign up for MyChart - ask how at front desk.    Admin Assistant: Tacy Learn tel: [phone]   ..............................................................................................................     Electronically signed on 12/18/2015 at 12:54 PM.

## 2015-12-19 LAB — HEMOGLOBIN A1C: Hemoglobin A1C: 5.4 % (ref 4.0–6.0)

## 2015-12-25 LAB — EKG 12-LEAD
P: 81 deg
PR: 172 ms
QRS: 98 deg
QRSD: 142 ms
QT: 460 ms
QTc: 424 ms
Rate: 51 {beats}/min
Severity: ABNORMAL
T: 155 deg

## 2016-01-08 ENCOUNTER — Ambulatory Visit: Payer: Self-pay | Admitting: Cardiology

## 2016-01-18 ENCOUNTER — Other Ambulatory Visit: Payer: Self-pay | Admitting: Cardiology

## 2016-01-18 DIAGNOSIS — I2510 Atherosclerotic heart disease of native coronary artery without angina pectoris: Secondary | ICD-10-CM

## 2016-06-11 ENCOUNTER — Other Ambulatory Visit: Payer: Self-pay | Admitting: Cardiology

## 2016-06-11 DIAGNOSIS — E785 Hyperlipidemia, unspecified: Secondary | ICD-10-CM

## 2016-06-11 MED ORDER — ROSUVASTATIN CALCIUM 20 MG PO TABS *I*
20.0000 mg | ORAL_TABLET | Freq: Every day | ORAL | 3 refills | Status: DC
Start: 2016-06-11 — End: 2017-06-23

## 2016-06-11 NOTE — Telephone Encounter (Signed)
Needs refill for Rosuvastatin 30 mg daily #90    [facility] - [place]

## 2016-11-25 ENCOUNTER — Other Ambulatory Visit: Payer: Self-pay | Admitting: Cardiology

## 2016-11-25 DIAGNOSIS — Z9189 Other specified personal risk factors, not elsewhere classified: Secondary | ICD-10-CM

## 2016-12-10 ENCOUNTER — Other Ambulatory Visit: Payer: Self-pay | Admitting: Cardiology

## 2016-12-10 DIAGNOSIS — I2510 Atherosclerotic heart disease of native coronary artery without angina pectoris: Secondary | ICD-10-CM

## 2016-12-10 MED ORDER — METOPROLOL SUCCINATE 25 MG PO TB24 *I*
12.5000 mg | ORAL_TABLET | Freq: Every day | ORAL | 3 refills | Status: DC
Start: 2016-12-10 — End: 2018-04-07

## 2016-12-10 NOTE — Telephone Encounter (Signed)
Attempted to leave message for patient regarding refill: mailbox full. Metoprolol request sent to Dr. Hermelinda Medicus for approval.  Linus Salmons, RN, RVT

## 2016-12-10 NOTE — Telephone Encounter (Signed)
Needs refill for metoprolol (Toprol XL) 25 mg 1/2 daily #45    [facility] - [address]

## 2016-12-16 ENCOUNTER — Ambulatory Visit: Payer: No Typology Code available for payment source | Attending: Cardiology | Admitting: Cardiology

## 2016-12-16 ENCOUNTER — Encounter: Payer: Self-pay | Admitting: Cardiology

## 2016-12-16 ENCOUNTER — Other Ambulatory Visit
Admission: RE | Admit: 2016-12-16 | Discharge: 2016-12-16 | Disposition: A | Discharge status: Home / Self Care | Payer: Medicare Other | Source: Ambulatory Visit | Attending: Cardiology | Admitting: Cardiology

## 2016-12-16 ENCOUNTER — Other Ambulatory Visit: Payer: Self-pay | Admitting: Cardiology

## 2016-12-16 VITALS — BP 107/63 | HR 52 | Ht 72.0 in | Wt 146.0 lb

## 2016-12-16 DIAGNOSIS — E785 Hyperlipidemia, unspecified: Secondary | ICD-10-CM | POA: Insufficient documentation

## 2016-12-16 DIAGNOSIS — Z9189 Other specified personal risk factors, not elsewhere classified: Secondary | ICD-10-CM

## 2016-12-16 DIAGNOSIS — I2510 Atherosclerotic heart disease of native coronary artery without angina pectoris: Secondary | ICD-10-CM

## 2016-12-16 DIAGNOSIS — Z7982 Long term (current) use of aspirin: Secondary | ICD-10-CM

## 2016-12-16 DIAGNOSIS — Z79899 Other long term (current) drug therapy: Secondary | ICD-10-CM | POA: Insufficient documentation

## 2016-12-16 LAB — CBC AND DIFFERENTIAL
Baso # K/uL: 0 10*3/uL (ref 0.0–0.1)
Basophil %: 0.5 %
Eos # K/uL: 0.2 10*3/uL (ref 0.0–0.5)
Eosinophil %: 4.2 %
Hematocrit: 48 % (ref 40–51)
Hemoglobin: 16.2 g/dL (ref 13.7–17.5)
IMM Granulocytes #: 0 10*3/uL (ref 0.0–0.1)
IMM Granulocytes: 0.2 %
Lymph # K/uL: 0.9 10*3/uL — ABNORMAL LOW (ref 1.3–3.6)
Lymphocyte %: 16.9 %
MCH: 32 pg/cell (ref 26–32)
MCHC: 34 g/dL (ref 32–37)
MCV: 95 fL — ABNORMAL HIGH (ref 79–92)
Mono # K/uL: 0.4 10*3/uL (ref 0.3–0.8)
Monocyte %: 7.8 %
Neut # K/uL: 3.9 10*3/uL (ref 1.8–5.4)
Nucl RBC # K/uL: 0 10*3/uL (ref 0.0–0.0)
Nucl RBC %: 0 /100 WBC (ref 0.0–0.2)
Platelets: 192 10*3/uL (ref 150–330)
RBC: 5.1 MIL/uL (ref 4.6–6.1)
RDW: 12 % (ref 11.6–14.4)
Seg Neut %: 70.4 %
WBC: 5.5 10*3/uL (ref 4.2–9.1)

## 2016-12-16 LAB — COMPREHENSIVE METABOLIC PANEL
ALT: 75 U/L — ABNORMAL HIGH (ref 0–50)
AST: 37 U/L (ref 0–50)
Albumin: 4.7 g/dL (ref 3.5–5.2)
Alk Phos: 65 U/L (ref 40–130)
Anion Gap: 13 (ref 7–16)
Bilirubin,Total: 0.6 mg/dL (ref 0.0–1.2)
CO2: 26 mmol/L (ref 20–28)
Calcium: 9.2 mg/dL (ref 8.6–10.2)
Chloride: 102 mmol/L (ref 96–108)
Creatinine: 0.94 mg/dL (ref 0.67–1.17)
GFR,Black: 94 *
GFR,Caucasian: 81 *
Glucose: 94 mg/dL (ref 60–99)
Lab: 17 mg/dL (ref 6–20)
Potassium: 4.6 mmol/L (ref 3.3–5.1)
Sodium: 141 mmol/L (ref 133–145)
Total Protein: 6.8 g/dL (ref 6.3–7.7)

## 2016-12-16 LAB — LIPID PANEL
Chol/HDL Ratio: 1.7
Cholesterol: 182 mg/dL
HDL: 109 mg/dL
LDL Calculated: 61 mg/dL
Non HDL Cholesterol: 73 mg/dL
Triglycerides: 62 mg/dL

## 2016-12-16 LAB — HIGH SENSITIVITY CRP: CRP,High Sensitivity: 0.4 mg/L

## 2016-12-16 LAB — TSH: TSH: 2.45 u[IU]/mL (ref 0.27–4.20)

## 2016-12-16 NOTE — Progress Notes (Addendum)
Cardiology Office Revisit Note    Date of Visit: 12/16/2016 Patient: Donnetta Simpers   Patients PCP: Cindra Eves, MD Patient DOB: [birth year]     Subjective/Reason For Visit     I had the pleasure of seeing ZURI BRADWAY in cardiology followup on 12/16/2016. Last visit was 1 year ago.    Prostate a/w recurrent bleeding     Cardiovascular symptoms: None   Had food poisoning 2 days ago - today with sinus brady 47 bpm associated with vagatonia     Smoking: Never     Medications: Reviewed.     Diet: Reviewed in detail. Plant based dietary recommendations reviewed in detail.     Caffeine: none tea AM     Etoh: none     Exercise: Reviewed. 30 min aerobic + 30 min resistance 4 days per week     Sleep: Reviewed. Nocturia x 2-3 x ;       Past Medical History:   Diagnosis Date    ERM OD (epiretinal membrane, right eye)     Supraventricular Tachycardia 10/06/2008    Created by Conversion     Visual discomfort of right eye     blurry vision - 71yrs     Past Surgical History:   Procedure Laterality Date    CONVERTED PROCEDURE      Colon Surgery Conversion Data        ROS Review of Systems  Constitutional: Appetite good, no fevers, night sweats or weight loss  HEENT: Unremarkable   Breast: No mass, tenderness or discharge.   Respiratory: No cough, wheezing or dyspnea. Does not snore or have apneic episodes.   CV: See HPI. No chest pain, shortness of breath or peripheral edema  GI: No GERD. No nausea/vomiting, or change in bowel habits. Does note abdominal pains in lower abdomen and back. Groins pains, no hernia.   GU: Prostate prostatis with bleeding   Musculoskeletal: No bony or muscular tenderness.   Neuro: No MS changes, no motor weakness, no sensory changes   Endocrine: No thyroid disease or diabetes mellitus.   Allergy / Hematology / Rheumatology: NKDA. No dry mouth, dry eyes.    Psychiatric: No mood or thought issues.   Skin: Unremarkable.         Medications     Current Outpatient Prescriptions   Medication Sig     metoprolol (TOPROL-XL) 25 MG 24 hr tablet Take 0.5 tablets (12.5 mg total) by mouth daily   Do not crush or chew. May be divided.    rosuvastatin (CRESTOR) 20 MG tablet Take 1 tablet (20 mg total) by mouth daily (Patient taking differently: Take 40 mg by mouth daily   )    dutasteride (AVODART) 0.5 MG capsule     TURMERIC PO Take by mouth    aspirin 81 MG EC tablet Take 1 tablet (81 mg total) by mouth daily    Magnesium 250 MG TABS     B Complex Vitamins (VITAMIN B COMPLEX) capsule TAKE 1 CAPSULE DAILY.    Cholecalciferol (VITAMIN D) 2000 UNITS CAPS TAKE 1 CAPSULE DAILY     Vitals and Physical Exam     Kreg's  height is 1.829 m (6') and weight is 66.2 kg (146 lb). His blood pressure is 107/63 and his pulse is 52.  Body mass index is 19.8 kg/(m^2).    Physical Exam   Exam:     Visit Vitals    BP 107/63 (BP Location: Right arm, Patient Position: Sitting,  Cuff Size: adult)    Pulse 52    Ht 1.829 m (6')    Wt 66.2 kg (146 lb)    BMI 19.8 kg/m2      Neck: No JVD, no carotid bruit   Lungs clear to P&A   Cor S1S2 without gallop, murmur   Pulses: intact 4+ pulses, symmetric    Abdomen: non-tender, no mass or organomegaly   Extremities: no cyanosis, clubbing edema, cellulitis, calf tenderness, or Homan's sign.    Neuro: A&Ox3 grossly normal     Laboratory Data     Hematology:   Results in Past 730 Days  Result Component Current Result Previous Result   WBC 5.6 (12/18/2015) Not in Time Range   Hemoglobin 15.6 (12/18/2015) Not in Time Range   Hematocrit 47 (12/18/2015) Not in Time Range   Platelets 177 (12/18/2015) Not in Time Range     Chemistry:   Results in Past 730 Days  Result Component Current Result Previous Result   Sodium 143 (12/18/2015) Not in Time Range   Potassium 4.4 (12/18/2015) Not in Time Range   Creatinine 0.95 (12/18/2015) Not in Time Range   Glucose 84 (12/18/2015) Not in Time Range   Calcium 9.3 (12/18/2015) Not in Time Range   Hemoglobin A1C 5.4 (12/18/2015) Not in Time Range   AST 43 (12/18/2015) Not  in Time Range   ALT 62 (H) (12/18/2015) Not in Time Range   TSH 1.73 (12/18/2015) Not in Time Range     Coagulation Studies:   No results found for requested labs within last 730 days.     Cardiac:   No results found for requested labs within last 730 days.     Lipids:   Results in Past 730 Days  Result Component Current Result Previous Result   Cholesterol 183 (12/18/2015) Not in Time Range   HDL 124 (12/18/2015) Not in Time Range   Triglycerides 44 (12/18/2015) Not in Time Range   LDL Calculated 50 (12/18/2015) Not in Time Range   Chol/HDL Ratio 1.5 (12/18/2015) Not in Time Range   CRP,High Sensitivity 0.6 (12/18/2015) Not in Time Range     Cardiac/Imaging Data & Risk Scores     ECG: Sinus brady 47 RBBB LPHB NSC 12/18/2015                           Impression and Plan     Patient Active Problem List   Diagnosis Code    Other specified cardiac dysrhythmias I49.8    CAD (coronary artery disease),Multiple stents RCA Nov 2009 [facility]  I25.10    Coronary atherosclerosis I25.10    Dyslipidemia E78.5    Visual discomfort of right eye H53.141    Epiretinal membrane, right eye H35.371    Horseshoe retinal tear, 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   - Advised to restart ASA at 81 mg every other day   - On avodart, no further  pain urinating   - Continue Crestor 20 mg every day   - Taper and discontinue Toprol XL as indicated below     # Hx Bleeding on Antiplatelet Medications   Finnean Cerami has stopped his clopidogrel and aspirin because of easy bleeding, which he did not report to me nor did he admit to when I inquired about any new medical  issues.   MATCH Trial has demonstrated benefit of Plavix and bleeding risk of aspirin.   After discussion, he refuses clopidogrel I have recommended.   Taking ASA 81 mg every day      # Dyslipidemia   - 2013 ACC AHA guidelines for patient with CAD, increasedto 20 mg every day   - On crestor 20, favorable lipid panel   - Repeat blood work today     # Sinus bradycardia 47   - Had food poisoning 2 days ago - today with sinus brady 47 bpm associated with vagatonia   - taper and discontinue metoprolol before you plan to start alpha blocker     # Psychosocial   - Attorney     I appreciate the opportunity to participate in the care of your patient.     Please do not hesitate to contact me for any questions or concerns.       ..............................................................................................................      ..............................................................................................................    Plan:     Our goals now:   .............................................................................................................Marland Kitchen    # Congratulations:   - Not smoking   - Physically active   - Plant based diet   - Weight control      # Continue current medications  - Crestor 20 mg every day   - Aspirin 81 mg every day   - taper and discontinue metoprolol:   -- No Toprol tonight; take tomorrow, then discontinue     - taper and discontinue metoprolol before you plan to start alpha blocker     # Lifestyle      Heart healthful whole food plant based diet   Weight: Body mass index is 19.8 kg/(m^2).     - Current status and targets for weight management reviewed.     -Exercise    - Walk 6-10,000 steps every day year round    - Isometric: Aim for 10 - 15 reps to achieve fatigue upper and lower body; rest  and repeat. Do every other day.      # Testing    - Lipid panel, hs-CRP, Metabolic Profile, HbA1C, TSH: obtain today and 1- 2 weeks prior to return to clinic.     - ECG next visit.     # Referral   -      # Follow-up:   - Your primary care physician (PCP), as scheduled.     - Cardiology: Return to clinic in 12 months.       Macky Lower Hermelinda Medicus MD MS, [facility]   Cardiology Attending   Professor of Medicine and Imaging Sciences  [facility] Clinical Cardiology Group     Questions, results? Sign up for MyChart - ask how at front desk.    Admin Assistant: Tacy Learn tel: [phone]   .............................................................................................................Marland Kitchen              Joline Maxcy, MD  Electronically signed on 12/16/2016 at 11:48 AM.

## 2016-12-17 LAB — HEMOGLOBIN A1C: Hemoglobin A1C: 5.4 % (ref 4.0–6.0)

## 2016-12-26 LAB — EKG 12-LEAD
P: 78 deg
PR: 176 ms
QRS: 99 deg
QRSD: 146 ms
QT: 476 ms
QTc: 421 ms
Rate: 47 {beats}/min
Severity: ABNORMAL
T: 153 deg

## 2017-06-23 ENCOUNTER — Other Ambulatory Visit: Payer: Self-pay | Admitting: Cardiology

## 2017-06-23 DIAGNOSIS — E785 Hyperlipidemia, unspecified: Secondary | ICD-10-CM

## 2017-09-10 DIAGNOSIS — L111 Transient acantholytic dermatosis [Grover]: Secondary | ICD-10-CM | POA: Diagnosis present

## 2017-12-08 ENCOUNTER — Other Ambulatory Visit: Payer: Self-pay

## 2017-12-08 ENCOUNTER — Inpatient Hospital Stay (HOSPITAL_COMMUNITY)
Admission: EM | Admit: 2017-12-08 | Discharge: 2017-12-11 | DRG: 378 | Disposition: A | Discharge status: Home / Self Care | Payer: Medicare Other | Attending: Internal Medicine | Admitting: Internal Medicine

## 2017-12-08 ENCOUNTER — Encounter (HOSPITAL_COMMUNITY): Payer: Self-pay | Admitting: Emergency Medicine

## 2017-12-08 DIAGNOSIS — D62 Acute posthemorrhagic anemia: Secondary | ICD-10-CM | POA: Diagnosis present

## 2017-12-08 DIAGNOSIS — G4733 Obstructive sleep apnea (adult) (pediatric): Secondary | ICD-10-CM | POA: Diagnosis present

## 2017-12-08 DIAGNOSIS — G4730 Sleep apnea, unspecified: Secondary | ICD-10-CM | POA: Diagnosis present

## 2017-12-08 DIAGNOSIS — Z96653 Presence of artificial knee joint, bilateral: Secondary | ICD-10-CM | POA: Diagnosis present

## 2017-12-08 DIAGNOSIS — Z7982 Long term (current) use of aspirin: Secondary | ICD-10-CM

## 2017-12-08 DIAGNOSIS — K922 Gastrointestinal hemorrhage, unspecified: Secondary | ICD-10-CM

## 2017-12-08 DIAGNOSIS — E861 Hypovolemia: Secondary | ICD-10-CM | POA: Diagnosis not present

## 2017-12-08 DIAGNOSIS — K219 Gastro-esophageal reflux disease without esophagitis: Secondary | ICD-10-CM | POA: Diagnosis present

## 2017-12-08 DIAGNOSIS — Z87891 Personal history of nicotine dependence: Secondary | ICD-10-CM

## 2017-12-08 DIAGNOSIS — K5730 Diverticulosis of large intestine without perforation or abscess without bleeding: Secondary | ICD-10-CM | POA: Diagnosis present

## 2017-12-08 DIAGNOSIS — Z79899 Other long term (current) drug therapy: Secondary | ICD-10-CM

## 2017-12-08 DIAGNOSIS — Z888 Allergy status to other drugs, medicaments and biological substances status: Secondary | ICD-10-CM

## 2017-12-08 DIAGNOSIS — K2270 Barrett's esophagus without dysplasia: Secondary | ICD-10-CM | POA: Diagnosis present

## 2017-12-08 DIAGNOSIS — K640 First degree hemorrhoids: Secondary | ICD-10-CM | POA: Diagnosis present

## 2017-12-08 DIAGNOSIS — Z885 Allergy status to narcotic agent status: Secondary | ICD-10-CM

## 2017-12-08 DIAGNOSIS — E785 Hyperlipidemia, unspecified: Secondary | ICD-10-CM | POA: Diagnosis present

## 2017-12-08 DIAGNOSIS — K5731 Diverticulosis of large intestine without perforation or abscess with bleeding: Principal | ICD-10-CM | POA: Diagnosis present

## 2017-12-08 DIAGNOSIS — L111 Transient acantholytic dermatosis [Grover]: Secondary | ICD-10-CM | POA: Diagnosis present

## 2017-12-08 DIAGNOSIS — K921 Melena: Secondary | ICD-10-CM | POA: Diagnosis present

## 2017-12-08 HISTORY — DX: Sleep apnea, unspecified: G47.30

## 2017-12-08 HISTORY — DX: Gastrointestinal hemorrhage, unspecified: K92.2

## 2017-12-08 LAB — CBC
HCT: 37.9 % — ABNORMAL LOW (ref 39.0–52.0)
HEMOGLOBIN: 12.5 g/dL — AB (ref 13.0–17.0)
MCH: 32.3 pg (ref 26.0–34.0)
MCHC: 33 g/dL (ref 30.0–36.0)
MCV: 97.9 fL (ref 78.0–100.0)
Platelets: 239 10*3/uL (ref 150–400)
RBC: 3.87 MIL/uL — AB (ref 4.22–5.81)
RDW: 14.2 % (ref 11.5–15.5)
WBC: 6.1 10*3/uL (ref 4.0–10.5)

## 2017-12-08 LAB — COMPREHENSIVE METABOLIC PANEL
ALK PHOS: 71 U/L (ref 38–126)
ALT: 24 U/L (ref 0–44)
AST: 29 U/L (ref 15–41)
Albumin: 3.7 g/dL (ref 3.5–5.0)
Anion gap: 7 (ref 5–15)
BUN: 30 mg/dL — ABNORMAL HIGH (ref 8–23)
CALCIUM: 8.9 mg/dL (ref 8.9–10.3)
CO2: 26 mmol/L (ref 22–32)
Chloride: 105 mmol/L (ref 98–111)
Creatinine, Ser: 1.23 mg/dL (ref 0.61–1.24)
GFR, EST NON AFRICAN AMERICAN: 57 mL/min — AB (ref >60)
Glucose, Bld: 107 mg/dL — ABNORMAL HIGH (ref 70–99)
Potassium: 4.7 mmol/L (ref 3.5–5.1)
Sodium: 138 mmol/L (ref 135–145)
TOTAL PROTEIN: 6.2 g/dL — AB (ref 6.5–8.1)
Total Bilirubin: 0.7 mg/dL (ref 0.3–1.2)

## 2017-12-08 LAB — TYPE AND SCREEN
ABO/RH(D): O POS
ANTIBODY SCREEN: NEGATIVE

## 2017-12-08 LAB — I-STAT CHEM 8, ED
BUN: 31 mg/dL — ABNORMAL HIGH (ref 8–23)
CALCIUM ION: 1.13 mmol/L — AB (ref 1.15–1.40)
Chloride: 104 mmol/L (ref 98–111)
Creatinine, Ser: 1.2 mg/dL (ref 0.61–1.24)
Glucose, Bld: 101 mg/dL — ABNORMAL HIGH (ref 70–99)
HCT: 37 % — ABNORMAL LOW (ref 39.0–52.0)
Hemoglobin: 12.6 g/dL — ABNORMAL LOW (ref 13.0–17.0)
Potassium: 4.6 mmol/L (ref 3.5–5.1)
Sodium: 139 mmol/L (ref 135–145)
TCO2: 24 mmol/L (ref 22–32)

## 2017-12-08 LAB — PROTIME-INR
INR: 1.18
PROTHROMBIN TIME: 14.9 s (ref 11.4–15.2)

## 2017-12-08 LAB — ABO/RH: ABO/RH(D): O POS

## 2017-12-08 LAB — APTT: aPTT: 30 seconds (ref 24–36)

## 2017-12-08 MED ORDER — SODIUM CHLORIDE 0.9 % IV SOLN
80.0000 mg | Freq: Once | INTRAVENOUS | Status: AC
  Administered 2017-12-08: 80 mg via INTRAVENOUS
  Filled 2017-12-08: qty 80

## 2017-12-08 MED ORDER — FOLIC ACID 1 MG PO TABS
1.0000 mg | ORAL_TABLET | Freq: Every day | ORAL | Status: DC
  Administered 2017-12-08 – 2017-12-11 (×3): 1 mg via ORAL
  Filled 2017-12-08 (×3): qty 1

## 2017-12-08 MED ORDER — ROSUVASTATIN CALCIUM 10 MG PO TABS
10.0000 mg | ORAL_TABLET | Freq: Every day | ORAL | Status: DC
  Administered 2017-12-08 – 2017-12-11 (×3): 10 mg via ORAL
  Filled 2017-12-08 (×4): qty 1

## 2017-12-08 MED ORDER — PANTOPRAZOLE SODIUM 40 MG IV SOLR: 40.0000 mg | Freq: Two times a day (BID) | INTRAVENOUS | Status: DC

## 2017-12-08 MED ORDER — LACTATED RINGERS IV SOLN
INTRAVENOUS | Status: DC
  Administered 2017-12-08: 16:00:00 via INTRAVENOUS

## 2017-12-08 MED ORDER — PANTOPRAZOLE SODIUM 40 MG PO TBEC: 40.0000 mg | DELAYED_RELEASE_TABLET | Freq: Every day | ORAL | Status: DC

## 2017-12-08 MED ORDER — POLYETHYLENE GLYCOL 3350 17 G PO PACK
17.0000 g | PACK | Freq: Three times a day (TID) | ORAL | Status: DC
  Administered 2017-12-08 – 2017-12-09 (×3): 17 g via ORAL
  Filled 2017-12-08 (×3): qty 1

## 2017-12-08 MED ORDER — SODIUM CHLORIDE 0.9 % IV SOLN
8.0000 mg/h | INTRAVENOUS | Status: DC
  Administered 2017-12-08 – 2017-12-09 (×3): 8 mg/h via INTRAVENOUS
  Filled 2017-12-08 (×8): qty 80

## 2017-12-08 NOTE — ED Provider Notes (Signed)
[facility] EMERGENCY DEPARTMENT Provider Note   CSN: [id number] Arrival date & time: 12/08/17  0807     History   Chief Complaint Chief Complaint  Patient presents with  . GI Bleeding    HPI Roger Hays is a 72 y.o. male.  72 yo M with a chief complaint of bright red blood per rectum.  Going on since this morning.  Has had 6 or 7 bowel movements.  Denies feeling lightheaded or dizzy.  Has had some abdominal cramping but no overt abdominal pain.  Started out dark and then became bright red.  Takes a baby aspirin every other day.  Denies fevers or chills.  Denies suspicious food intake.  The history is provided by the patient.  Rectal Bleeding  Quality:  Bright red Amount:  Copious Duration:  6 hours Timing:  Constant Chronicity:  New Context: diarrhea   Similar prior episodes: no   Relieved by:  Nothing Worsened by:  Nothing Ineffective treatments:  None tried Associated symptoms: no abdominal pain, no fever, no light-headedness, no loss of consciousness and no vomiting   Risk factors: no anticoagulant use     History reviewed. No pertinent past medical history.  There are no active problems to display for this patient.   History reviewed. No pertinent surgical history.      Home Medications    Prior to Admission medications   Medication Sig Start Date End Date Taking? Authorizing Provider  aspirin EC 81 MG tablet Take 81 mg by mouth every other day.   Yes [provider]  diclofenac sodium (VOLTAREN) 1 % GEL Apply 1 application topically as needed for pain. elbow 11/13/17  Yes [provider]  folic acid (FOLVITE) 1 MG tablet Take 1 mg by mouth daily. 11/18/17  Yes [provider]  glycopyrrolate (ROBINUL) 1 MG tablet Take 1 mg by mouth 2 (two) times daily as needed.  11/18/17 11/18/18 Yes [provider]  meloxicam (MOBIC) 15 MG tablet Take 15 mg by mouth as needed. 11/18/17  Yes [provider]    methotrexate (RHEUMATREX) 2.5 MG tablet Take 7.5 mg by mouth every Wednesday. evening 11/18/17  Yes [provider]  pantoprazole (PROTONIX) 40 MG tablet Take 40 mg by mouth daily. 11/27/17  Yes [provider]  Probiotic Product (ALIGN) 4 MG CAPS Take 4 mg by mouth daily.   Yes [provider]  rosuvastatin (CRESTOR) 10 MG tablet Take 10 mg by mouth daily. 11/27/17  Yes [provider]    Family History History reviewed. No pertinent family history.  Social History Social History   Tobacco Use  . Smoking status: Never Smoker  . Smokeless tobacco: Never Used  Substance Use Topics  . Alcohol use: Not on file  . Drug use: Not on file     Allergies   Oxycodone; Pseudoephedrine; and Vicodin [hydrocodone-acetaminophen]   Review of Systems Review of Systems  Constitutional: Negative for chills and fever.  HENT: Negative for congestion and facial swelling.   Eyes: Negative for discharge and visual disturbance.  Respiratory: Negative for shortness of breath.   Cardiovascular: Negative for chest pain and palpitations.  Gastrointestinal: Positive for blood in stool and hematochezia. Negative for abdominal pain, diarrhea and vomiting.  Musculoskeletal: Negative for arthralgias and myalgias.  Skin: Negative for color change and rash.  Neurological: Negative for tremors, loss of consciousness, syncope, light-headedness and headaches.  Psychiatric/Behavioral: Negative for confusion and dysphoric mood.     Physical Exam  Updated Vital Signs BP 117/83   Pulse 68   Temp 97.9 F (36.6 C) (Oral)   Resp 12   SpO2 100%   Physical Exam  Constitutional: He is oriented to person, place, and time. He appears well-developed and well-nourished.  HENT:  Head: Normocephalic and atraumatic.  Eyes: Pupils are equal, round, and reactive to light. EOM are normal.  Neck: Normal range of motion. Neck supple. No JVD present.  Cardiovascular: Normal rate and  regular rhythm. Exam reveals no gallop and no friction rub.  No murmur heard. Pulmonary/Chest: No respiratory distress. He has no wheezes.  Abdominal: He exhibits no distension and no mass. There is no tenderness. There is no rebound and no guarding.  Genitourinary:  Genitourinary Comments: Hemorrhoid to the 9 o'clock position, not bleeding.  Gross blood at the rectum.  Musculoskeletal: Normal range of motion.  Neurological: He is alert and oriented to person, place, and time.  Skin: No rash noted. No pallor.  Psychiatric: He has a normal mood and affect. His behavior is normal.  Nursing note and vitals reviewed.    ED Treatments / Results  Labs (all labs ordered are listed, but only abnormal results are displayed) Labs Reviewed  COMPREHENSIVE METABOLIC PANEL - Abnormal; Notable for the following components:      Result Value   Glucose, Bld 107 (*)    BUN 30 (*)    Total Protein 6.2 (*)    GFR calc non Af Amer 57 (*)    All other components within normal limits  CBC - Abnormal; Notable for the following components:   RBC 3.87 (*)    Hemoglobin 12.5 (*)    HCT 37.9 (*)    All other components within normal limits  I-STAT CHEM 8, ED - Abnormal; Notable for the following components:   BUN 31 (*)    Glucose, Bld 101 (*)    Calcium, Ion 1.13 (*)    Hemoglobin 12.6 (*)    HCT 37.0 (*)    All other components within normal limits  POC OCCULT BLOOD, ED  TYPE AND SCREEN  ABO/RH    EKG None  Radiology No results found.  Procedures Procedures (including critical care time)  Medications Ordered in ED Medications - No data to display   Initial Impression / Assessment and Plan / ED Course  I have reviewed the triage vital signs and the nursing notes.  Pertinent labs & imaging results that were available during my care of the patient were reviewed by me and considered in my medical decision making (see chart for details).     72 yo M with a chief complaint of bright red  blood per rectum.  The patient has had 6 or 7 bowel movements this morning that started out dark and then turned to bright red.  Had some cramping in his abdomen but no overt abdominal pain denies fevers.  Takes a baby aspirin every other day.  I discussed case with Dr. Randa Evens, gastroenterology.  He recommended hospitalist admission and he requested that they page him once they have seen the patient.  The patient was reassessed and was having more frequent bowel movements that had about 5 or 6 since my discussion with Dr. Randa Evens.  We discussed this with him and he did not feel that imaging was required at this time.  Recommended supporting the patient in getting him into the hospital.  CRITICAL CARE Performed by: Rae Roam   Total critical care time: 35 minutes  Critical care time was exclusive of separately billable procedures and treating other patients.  Critical care was necessary to treat or prevent imminent or life-threatening deterioration.  Critical care was time spent personally by me on the following activities: development of treatment plan with patient and/or surrogate as well as nursing, discussions with consultants, evaluation of patient's response to treatment, examination of patient, obtaining history from patient or surrogate, ordering and performing treatments and interventions, ordering and review of laboratory studies, ordering and review of radiographic studies, pulse oximetry and re-evaluation of patient's condition.  The patients results and plan were reviewed and discussed.   Any x-rays performed were independently reviewed by myself.   Differential diagnosis were considered with the presenting HPI.  Medications - No data to display  Vitals:   12/08/17 0840 12/08/17 0945 12/08/17 1000 12/08/17 1030  BP: (!) 136/95 115/85 119/85 117/83  Pulse: 66 66 67 68  Resp: 20 11 14 12   Temp:      TempSrc:      SpO2: 98% 96% 97% 100%    Final diagnoses:   Lower GI bleed    Admission/ observation were discussed with the admitting physician, patient and/or family and they are comfortable with the plan.   Final Clinical Impressions(s) / ED Diagnoses   Final diagnoses:  Lower GI bleed    ED Discharge Orders    None       Melene Plan, DO 12/08/17 1347

## 2017-12-08 NOTE — ED Notes (Signed)
Pt has had up to 10 bright red bloody BM , MD notified

## 2017-12-08 NOTE — Progress Notes (Signed)
Patient refused CPAP for tonight. RT informed patient to have RT called if he changes his mind. RT will monitor as needed. 

## 2017-12-08 NOTE — H&P (View-Only) (Signed)
[facility] Reason for consult: GI bleeding Referring Physician: Dr. Derrill Center, PCP.  Primary GI: Dr. Donney Dice Roger Hays is an 72 y.o. male.  HPI: He sees Dr. Cristina Gong on a regular basis for Barrett's esophagus.  He gets EGDs and biopsies every 2 to 3 years.  He has chronic reflux and is on chronic pantoprazole.  He also has a history of diverticular disease on colonoscopies as well as diverticulitis.  He has not had diverticulitis for several years and he and his wife note that he takes a lot of fiber and keeps his stools quite soft.  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  He had taken some meloxicam and ibuprofen recently for some arthritic pain but does not take this on a regular basis.  He been in his usual state of health but woke up during the night with passage of stool it was bright red.  He had multiple bloody stools and continued to pass him this morning and was taken to the emergency room.  His hemoglobin was 12 had been 15 just a couple weeks ago.  He passed some bright blood but has had none since.  He notes that when he first woke up with this pain he had some abdominal discomfort and has had 1 or 2 episodes of this since but is had no real increase in his discomfort or has passed any more bowel movements.  Past Medical History:  Diagnosis Date  . GI bleeding 12/08/2017  . Sleep apnea    CPAP    Past Surgical History:  Procedure Laterality Date  . APPENDECTOMY    . CARPAL TUNNEL RELEASE    . ELBOW SURGERY    . SHOULDER SURGERY    . TOTAL KNEE ARTHROPLASTY Bilateral     History reviewed. No pertinent family history.  Social History:  reports that he has quit smoking. He has never used smokeless tobacco. He reports that he drinks alcohol. He reports that he does not use drugs.  Allergies:  Allergies  Allergen Reactions  . Oxycodone Hives  . Pseudoephedrine Other (See Comments)    Unknown  . Vicodin  [Hydrocodone-Acetaminophen] Hives    Medications; Prior to Admission medications   Medication Sig Start Date End Date Taking? Authorizing Provider  aspirin EC 81 MG tablet Take 81 mg by mouth every other day.   Yes [provider]  diclofenac sodium (VOLTAREN) 1 % GEL Apply 1 application topically as needed for pain. elbow 11/13/17  Yes [provider]  folic acid (FOLVITE) 1 MG tablet Take 1 mg by mouth daily. 11/18/17  Yes [provider]  glycopyrrolate (ROBINUL) 1 MG tablet Take 1 mg by mouth 2 (two) times daily as needed.  11/18/17 11/18/18 Yes [provider]  meloxicam (MOBIC) 15 MG tablet Take 15 mg by mouth as needed. 11/18/17  Yes [provider]  methotrexate (RHEUMATREX) 2.5 MG tablet Take 7.5 mg by mouth every Wednesday. evening 11/18/17  Yes [provider]  pantoprazole (PROTONIX) 40 MG tablet Take 40 mg by mouth daily. 11/27/17  Yes [provider]  Probiotic Product (ALIGN) 4 MG CAPS Take 4 mg by mouth daily.   Yes [provider]  rosuvastatin (CRESTOR) 10 MG tablet Take 10 mg by mouth daily. 11/27/17  Yes [provider]   . folic acid  1 mg Oral Daily  . [START ON 12/12/2017] pantoprazole  40 mg Intravenous Q12H  . polyethylene  glycol  17 g Oral TID  . rosuvastatin  10 mg Oral Daily   PRN Meds  Results for orders placed or performed during the hospital encounter of 12/08/17 (from the past 48 hour(s))  Comprehensive metabolic panel     Status: Abnormal   Collection Time: 12/08/17  8:54 AM  Result Value Ref Range   Sodium 138 135 - 145 mmol/L   Potassium 4.7 3.5 - 5.1 mmol/L   Chloride 105 98 - 111 mmol/L   CO2 26 22 - 32 mmol/L   Glucose, Bld 107 (H) 70 - 99 mg/dL   BUN 30 (H) 8 - 23 mg/dL   Creatinine, Ser 1.23 0.61 - 1.24 mg/dL   Calcium 8.9 8.9 - 10.3 mg/dL   Total Protein 6.2 (L) 6.5 - 8.1 g/dL   Albumin 3.7 3.5 - 5.0 g/dL   AST 29 15 - 41 U/L   ALT 24 0 - 44 U/L   Alkaline Phosphatase  71 38 - 126 U/L   Total Bilirubin 0.7 0.3 - 1.2 mg/dL   GFR calc non Af Amer 57 (L) >60 mL/min   GFR calc Af Amer >60 >60 mL/min    Comment: (NOTE) The eGFR has been calculated using the CKD EPI equation. This calculation has not been validated in all clinical situations. eGFR's persistently <60 mL/min signify possible Chronic Kidney Disease.    Anion gap 7 5 - 15    Comment: Performed at [facility] [address], [place], [place] [address]  CBC     Status: Abnormal   Collection Time: 12/08/17  8:54 AM  Result Value Ref Range   WBC 6.1 4.0 - 10.5 K/uL   RBC 3.87 (L) 4.22 - 5.81 MIL/uL   Hemoglobin 12.5 (L) 13.0 - 17.0 g/dL   HCT 37.9 (L) 39.0 - 52.0 %   MCV 97.9 78.0 - 100.0 fL   MCH 32.3 26.0 - 34.0 pg   MCHC 33.0 30.0 - 36.0 g/dL   RDW 14.2 11.5 - 15.5 %   Platelets 239 150 - 400 K/uL    Comment: Performed at [facility], [place] [address], [place], [place] [address]  Type and screen [place]     Status: None   Collection Time: 12/08/17  8:54 AM  Result Value Ref Range   ABO/RH(D) O POS    Antibody Screen NEG    Sample Expiration 12/11/2017    Blood Bank Correction      CALLED [place] RN @ 1004 ON 9/3/019 BY ROBINSON Z.  Performed at [facility], [place] [address], [place], [place] [address]   ABO/Rh     Status: None   Collection Time: 12/08/17  8:54 AM  Result Value Ref Range   ABO/RH(D)      O POS Performed at [facility] [address] [place], [place] [address]   I-Stat Chem 8, ED     Status: Abnormal   Collection Time: 12/08/17  9:03 AM  Result Value Ref Range   Sodium 139 135 - 145 mmol/L   Potassium 4.6 3.5 - 5.1 mmol/L   Chloride 104 98 - 111 mmol/L   BUN 31 (H) 8 - 23 mg/dL   Creatinine, Ser 1.20 0.61 - 1.24 mg/dL   Glucose, Bld 101 (H) 70 - 99 mg/dL   Calcium, Ion 1.13 (L) 1.15 - 1.40 mmol/L   TCO2 24 22 - 32 mmol/L   Hemoglobin 12.6 (L) 13.0 - 17.0  g/dL   HCT 37.0 (L) 39.0 - 52.0 %  Protime-INR     Status:  None   Collection Time: 12/08/17  3:03 PM  Result Value Ref Range   Prothrombin Time 14.9 11.4 - 15.2 seconds   INR 1.18     Comment: Performed at [facility] [address] [place], [place] [address]  APTT     Status: None   Collection Time: 12/08/17  3:03 PM  Result Value Ref Range   aPTT 30 24 - 36 seconds    Comment: Performed at [facility] [address], [place], [place] [address]    No results found.             Blood pressure 107/74, pulse 65, temperature 98 F (36.7 C), temperature source Oral, resp. rate 16, SpO2 98 %.  Physical exam:   General--alert white male in no acute distress ENT--nonicteric Neck--supple with no lymphadenopathy Heart--regular rate and rhythm without murmurs gallops Lungs--clear anteriorly and posteriorly Abdomen--nondistended good bowel sounds.  Very mild tenderness more on the left side. Psych--alert and oriented answers questions appropriately   Assessment: 1.  Acute lower GI bleed.  This is not likely to be upper GI since he is on chronic Protonix and has had regular upper EGDs to follow-up his Barrett's esophagus.  Most likely etiology is a diverticular bleed although with the discomfort and ischemic colitis is possible  Plan: We will go ahead and start him on clear liquids and MiraLAX.  If his symptoms improve, we can send him home.  If he continues to pass bloody stools and drops his hemoglobin we may need to do a colonoscopy or flexible sigmoidoscopy.  Have discussed this in detail with the patient and his wife.   Nancy Fetter 12/08/2017, 5:20 PM   This note was created using voice recognition software and minor errors may Have occurred unintentionally. Pager: [phone] If no answer or after hours call [phone]

## 2017-12-08 NOTE — H&P (Signed)
Date: 12/08/2017               Patient Name:  Roger Hays MRN: [id number]  DOB: [birth year] Age / Sex: 72 y.o., male   PCP: Roger Hays., MD         Medical Service: Internal Medicine Teaching Service         Attending Physician: Dr. Gust Rung, DO    First Contact: Dr. Dortha Hays Pager: [phone]  Second Contact: Dr. 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 Patient reports he was in his usual state of health when he noted dark red/maroon stool while having a BM this morning. Since then, he had approximately ~6 bowel movements which turned progressively bright red. Just prior to having a bowel movement he has lower abdominal discomfort which is somewhat "crampy" but resolves after a BM. He denies prior history of GIB, recent travel, new foods, sick contacts or abnormal colonoscopies. Does report history of Barrett's esophagus for which he receives regular EGDs but 'graduated' from having colonoscopies. Admits to several medication changes recently including recent antibiotics but no diarrhea, starting Methotrexate/folic acid in August for Grovers disease and taking Meloxicam/Voltaren gel for OA pain up until 1-week ago. He did take 2 ibuprofen yesterday and takes ASA 81mg  every other day, but otherwise denies NSAID use. He reports adherence to his PPI and fells his symptoms are well-controlled, although did have some epigastric discomfort & heartburn last week which resolved spontaneously. He denies any additional abdominal pain, nausea, vomiting, weakness, fatigue, shortness of breath or  chest pain.   In the ED, BP initially 143/93, pulse 87, respirations 16 and he was saturating 96% on RA. CMET with Cr 1.2 and BUN 30 (baseline ~1.1, 20 respectively). CBC without leukocytosis but did show acute anemia with Hb 12 (Hb 15.5 11/18/17) but normal platelets. Non-bleeding external hemorrhoid and gross blood in rectum was appreciated. No FOBT obtained. Case discussed with Dr. Randa Hays who will see patient in consultation. IMTS was contacted for admission.   Meds:  Current Meds  Medication Sig  . aspirin EC 81 MG tablet Take 81 mg by mouth every other day.  . diclofenac sodium (VOLTAREN) 1 % GEL Apply 1 application topically as needed for pain. elbow  . folic acid (FOLVITE) 1 MG tablet Take 1 mg by mouth daily.  Marland Kitchen glycopyrrolate (ROBINUL) 1 MG tablet Take 1 mg by mouth 2 (two) times daily as needed.   . meloxicam (MOBIC) 15 MG tablet Take 15 mg by mouth as needed.  . methotrexate (RHEUMATREX) 2.5 MG tablet Take 7.5 mg by mouth every Wednesday. evening  . pantoprazole (PROTONIX) 40 MG tablet Take 40 mg by mouth daily.  . Probiotic Product (ALIGN) 4 MG CAPS Take 4 mg by mouth daily.  . rosuvastatin (CRESTOR) 10 MG tablet Take 10 mg by mouth daily.   Allergies: Allergies as of 12/08/2017 - Review Complete 12/08/2017  Allergen Reaction Noted  . Oxycodone Hives 01/24/2015  . Pseudoephedrine Other (See Comments) 05/22/2015  . Vicodin [hydrocodone-acetaminophen] Hives 01/24/2015   Past Medical History:  Diagnosis Date  . GI bleeding 12/08/2017  . Sleep apnea    CPAP   Family History: Mother and father with history of heart failure. Son with history of esophageal cancer.   Social History: Former smoker, quit over 40 years ago. Has approximately 5-6 alcoholic beverages weekly and denies history of withdrawal or seizures. Denied any recreational drug use. Lives at home with his wife Roger Hays (daughter of [facility] Dr. Leitha Hays) and has several children.   Review of Systems: A  complete ROS was negative except as per HPI.   Physical Exam: Blood pressure 117/83, pulse 68, temperature 97.9 F (36.6 C), temperature source Oral, resp. rate 12, SpO2 100 %. General: Very-pleasant caucasian male laying comfortably in bed. In no acute distress. Wife Roger at bedside, appears very supportive.  HENT: [place]/AT. EOMI. No conjunctival pallor, injection, icterus or ptosis. Mucous membranes moist.  Cardiovascular: Regular rate and rhythm. No murmur or rub appreciated. 2+ PT/DP pulses.  Pulmonary: CTA BL, no wheezing, crackles or rhonchi appreciated. Unlabored breathing on RA and speaks in full sentences.  Abdomen: Soft, not distended. Mild epigastric discomfort on palpation. +bowel sounds. Bright red blood on bed sheet. Extremities: No peripheral edema noted BL. Intact distal pulses. No gross deformities. Skin: Warm, dry. No cyanosis.  Neuro: Facial muscles symmetric. Normal tone, normal bulk. Moves all extremities spontaneously. Psych: Mood normal and affect was mood congruent. Responds to questions appropriately.   Assessment & Plan by Problem: Active Problems:   Hematochezia  Hematochezia Patient here with 1-day history of recurrent bright-red bowel movements and left-lower quadrant mild cramping relieved by defecation. He is currently HD stable but continues to have bright-red BMs since arrival to ED. Platelets are normal, however BUN elevated & Hb is down 3g from his most recent CBC 11/18/17. Orthostatic vitals negative in ED. No prior history of GIB that he's aware of although does have history of diverticular disease/diverticulitis and Barrett's esophagus. He's had several medication changes recently including initiation of Methotrexate/folate for Grover's disease and recent NSAID/ASA use, all of which puts patient at risk for GI mucosal injury and bleeding. Differential diagnosis includes diverticular bleed, peptic ulcer disease, ulceration related to Barretts esophagus, GI  mucositis related to MTX, hemorrhoidal bleeding or malignancy. -GI consulted in ED, appreciate their recommendations -Hold ASA, MTX, NSAIDs -SCDs for VTE ppx -NPO until GI recommendations ; LR @125mls /hr x 16 hrs until able to take PO -IV PPI dosed per pharmacy  Barrett's Esophagus: Follows with Dr. Matthias Hughs outpatient Patient reports compliance with once daily Protonix 40mg  although has been taking NSAIDs and recently started on MTX. Reports regular EGDs although none available in our EMR for viewing. Reports history of tobacco use and son with history of esophageal CA.  -PPI as above  OSA on CPAP: CPAP available QHS  Grovers' Disease: Follows with Dr. Reche Dixon, a dermatologist in [facility]. He's noticed improvement of his symptoms since starting MTX about 1 month ago. Next dose of MTX due Wednesday, holding for now but am continuing folate.   HLD: Continuing Crestor 10mg  daily. He denies history of MI or CVA.   Diet: NPO until GI recommendations DVT PPx: SCDs (active bleed) IVF: LR @ 121mls/hr x 16 hrs while NPO and ongoing blood loss Code status: FULL - discussed with patient and wife at bedside  Dispo: Admit patient to Observation with expected length of stay less than 2 midnights.  SignedNoemi Chapel, DO 12/08/2017, 11:09 AM  Pager: [phone]

## 2017-12-08 NOTE — ED Triage Notes (Signed)
Pt to ER for evaluation of new onset GI bleeding this morning, reports 5-6 diarrhea BM's that were initially dark but now are bright red. Pt reports lower abdominal pain.

## 2017-12-08 NOTE — ED Notes (Signed)
Pt has had 2 episodes of bright red bleeding in commode, denies feeling dizzy after, ambulated to and from room with steady gait.

## 2017-12-08 NOTE — Consult Note (Signed)
[facility] CONSULT Reason for consult: GI bleeding Referring Physician: Dr. Derrill Center, PCP.  Primary GI: Dr. Donney Dice Roger Hays is an 72 y.o. male.  HPI: He sees Dr. Cristina Gong on a regular basis for Barrett's esophagus.  He gets EGDs and biopsies every 2 to 3 years.  He has chronic reflux and is on chronic pantoprazole.  He also has a history of diverticular disease on colonoscopies as well as diverticulitis.  He has not had diverticulitis for several years and he and his wife note that he takes a lot of fiber and keeps his stools quite soft.  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  He had taken some meloxicam and ibuprofen recently for some arthritic pain but does not take this on a regular basis.  He been in his usual state of health but woke up during the night with passage of stool it was bright red.  He had multiple bloody stools and continued to pass him this morning and was taken to the emergency room.  His hemoglobin was 12 had been 15 just a couple weeks ago.  He passed some bright blood but has had none since.  He notes that when he first woke up with this pain he had some abdominal discomfort and has had 1 or 2 episodes of this since but is had no real increase in his discomfort or has passed any more bowel movements.  Past Medical History:  Diagnosis Date  . GI bleeding 12/08/2017  . Sleep apnea    CPAP    Past Surgical History:  Procedure Laterality Date  . APPENDECTOMY    . CARPAL TUNNEL RELEASE    . ELBOW SURGERY    . SHOULDER SURGERY    . TOTAL KNEE ARTHROPLASTY Bilateral     History reviewed. No pertinent family history.  Social History:  reports that he has quit smoking. He has never used smokeless tobacco. He reports that he drinks alcohol. He reports that he does not use drugs.  Allergies:  Allergies  Allergen Reactions  . Oxycodone Hives  . Pseudoephedrine Other (See Comments)    Unknown  . Vicodin  [Hydrocodone-Acetaminophen] Hives    Medications; Prior to Admission medications   Medication Sig Start Date End Date Taking? Authorizing Provider  aspirin EC 81 MG tablet Take 81 mg by mouth every other day.   Yes [provider]  diclofenac sodium (VOLTAREN) 1 % GEL Apply 1 application topically as needed for pain. elbow 11/13/17  Yes [provider]  folic acid (FOLVITE) 1 MG tablet Take 1 mg by mouth daily. 11/18/17  Yes [provider]  glycopyrrolate (ROBINUL) 1 MG tablet Take 1 mg by mouth 2 (two) times daily as needed.  11/18/17 11/18/18 Yes [provider]  meloxicam (MOBIC) 15 MG tablet Take 15 mg by mouth as needed. 11/18/17  Yes [provider]  methotrexate (RHEUMATREX) 2.5 MG tablet Take 7.5 mg by mouth every Wednesday. evening 11/18/17  Yes [provider]  pantoprazole (PROTONIX) 40 MG tablet Take 40 mg by mouth daily. 11/27/17  Yes [provider]  Probiotic Product (ALIGN) 4 MG CAPS Take 4 mg by mouth daily.   Yes [provider]  rosuvastatin (CRESTOR) 10 MG tablet Take 10 mg by mouth daily. 11/27/17  Yes [provider]   . folic acid  1 mg Oral Daily  . [START ON 12/12/2017] pantoprazole  40 mg Intravenous Q12H  . polyethylene  glycol  17 g Oral TID  . rosuvastatin  10 mg Oral Daily   PRN Meds  Results for orders placed or performed during the hospital encounter of 12/08/17 (from the past 48 hour(s))  Comprehensive metabolic panel     Status: Abnormal   Collection Time: 12/08/17  8:54 AM  Result Value Ref Range   Sodium 138 135 - 145 mmol/L   Potassium 4.7 3.5 - 5.1 mmol/L   Chloride 105 98 - 111 mmol/L   CO2 26 22 - 32 mmol/L   Glucose, Bld 107 (H) 70 - 99 mg/dL   BUN 30 (H) 8 - 23 mg/dL   Creatinine, Ser 1.23 0.61 - 1.24 mg/dL   Calcium 8.9 8.9 - 10.3 mg/dL   Total Protein 6.2 (L) 6.5 - 8.1 g/dL   Albumin 3.7 3.5 - 5.0 g/dL   AST 29 15 - 41 U/L   ALT 24 0 - 44 U/L   Alkaline Phosphatase  71 38 - 126 U/L   Total Bilirubin 0.7 0.3 - 1.2 mg/dL   GFR calc non Af Amer 57 (L) >60 mL/min   GFR calc Af Amer >60 >60 mL/min    Comment: (NOTE) The eGFR has been calculated using the CKD EPI equation. This calculation has not been validated in all clinical situations. eGFR's persistently <60 mL/min signify possible Chronic Kidney Disease.    Anion gap 7 5 - 15    Comment: Performed at [facility] [address], [place], [place] [address]  CBC     Status: Abnormal   Collection Time: 12/08/17  8:54 AM  Result Value Ref Range   WBC 6.1 4.0 - 10.5 K/uL   RBC 3.87 (L) 4.22 - 5.81 MIL/uL   Hemoglobin 12.5 (L) 13.0 - 17.0 g/dL   HCT 37.9 (L) 39.0 - 52.0 %   MCV 97.9 78.0 - 100.0 fL   MCH 32.3 26.0 - 34.0 pg   MCHC 33.0 30.0 - 36.0 g/dL   RDW 14.2 11.5 - 15.5 %   Platelets 239 150 - 400 K/uL    Comment: Performed at [facility], [place] [address] [place], [place] [address]  Type and screen [place]     Status: None   Collection Time: 12/08/17  8:54 AM  Result Value Ref Range   ABO/RH(D) O POS    Antibody Screen NEG    Sample Expiration 12/11/2017    Blood Bank Correction      CALLED [place] RN @ 1004 ON 9/3/019 BY ROBINSON Z.  Performed at [facility], [place] [address], [place], [place] [address]   ABO/Rh     Status: None   Collection Time: 12/08/17  8:54 AM  Result Value Ref Range   ABO/RH(D)      O POS Performed at [facility] [address] [place], [place] [address]   I-Stat Chem 8, ED     Status: Abnormal   Collection Time: 12/08/17  9:03 AM  Result Value Ref Range   Sodium 139 135 - 145 mmol/L   Potassium 4.6 3.5 - 5.1 mmol/L   Chloride 104 98 - 111 mmol/L   BUN 31 (H) 8 - 23 mg/dL   Creatinine, Ser 1.20 0.61 - 1.24 mg/dL   Glucose, Bld 101 (H) 70 - 99 mg/dL   Calcium, Ion 1.13 (L) 1.15 - 1.40 mmol/L   TCO2 24 22 - 32 mmol/L   Hemoglobin 12.6 (L) 13.0 - 17.0  g/dL   HCT 37.0 (L) 39.0 - 52.0 %  Protime-INR     Status:  None   Collection Time: 12/08/17  3:03 PM  Result Value Ref Range   Prothrombin Time 14.9 11.4 - 15.2 seconds   INR 1.18     Comment: Performed at [facility] [address] [place], [place] [address]  APTT     Status: None   Collection Time: 12/08/17  3:03 PM  Result Value Ref Range   aPTT 30 24 - 36 seconds    Comment: Performed at [facility] [address], [place], [place] [address]    No results found.             Blood pressure 107/74, pulse 65, temperature 98 F (36.7 C), temperature source Oral, resp. rate 16, SpO2 98 %.  Physical exam:   General--alert white male in no acute distress ENT--nonicteric Neck--supple with no lymphadenopathy Heart--regular rate and rhythm without murmurs gallops Lungs--clear anteriorly and posteriorly Abdomen--nondistended good bowel sounds.  Very mild tenderness more on the left side. Psych--alert and oriented answers questions appropriately   Assessment: 1.  Acute lower GI bleed.  This is not likely to be upper GI since he is on chronic Protonix and has had regular upper EGDs to follow-up his Barrett's esophagus.  Most likely etiology is a diverticular bleed although with the discomfort and ischemic colitis is possible  Plan: We will go ahead and start him on clear liquids and MiraLAX.  If his symptoms improve, we can send him home.  If he continues to pass bloody stools and drops his hemoglobin we may need to do a colonoscopy or flexible sigmoidoscopy.  Have discussed this in detail with the patient and his wife.   Nancy Fetter 12/08/2017, 5:20 PM   This note was created using voice recognition software and minor errors may Have occurred unintentionally. Pager: [phone] If no answer or after hours call [phone]

## 2017-12-09 DIAGNOSIS — K5790 Diverticulosis of intestine, part unspecified, without perforation or abscess without bleeding: Secondary | ICD-10-CM

## 2017-12-09 DIAGNOSIS — Z79899 Other long term (current) drug therapy: Secondary | ICD-10-CM | POA: Diagnosis not present

## 2017-12-09 DIAGNOSIS — K2270 Barrett's esophagus without dysplasia: Secondary | ICD-10-CM | POA: Diagnosis present

## 2017-12-09 DIAGNOSIS — Z7982 Long term (current) use of aspirin: Secondary | ICD-10-CM | POA: Diagnosis not present

## 2017-12-09 DIAGNOSIS — Z96653 Presence of artificial knee joint, bilateral: Secondary | ICD-10-CM | POA: Diagnosis present

## 2017-12-09 DIAGNOSIS — Z885 Allergy status to narcotic agent status: Secondary | ICD-10-CM | POA: Diagnosis not present

## 2017-12-09 DIAGNOSIS — D62 Acute posthemorrhagic anemia: Secondary | ICD-10-CM | POA: Diagnosis present

## 2017-12-09 DIAGNOSIS — K5730 Diverticulosis of large intestine without perforation or abscess without bleeding: Secondary | ICD-10-CM | POA: Diagnosis present

## 2017-12-09 DIAGNOSIS — G4733 Obstructive sleep apnea (adult) (pediatric): Secondary | ICD-10-CM | POA: Diagnosis present

## 2017-12-09 DIAGNOSIS — K921 Melena: Secondary | ICD-10-CM | POA: Diagnosis not present

## 2017-12-09 DIAGNOSIS — E861 Hypovolemia: Secondary | ICD-10-CM | POA: Diagnosis not present

## 2017-12-09 DIAGNOSIS — Z888 Allergy status to other drugs, medicaments and biological substances status: Secondary | ICD-10-CM | POA: Diagnosis not present

## 2017-12-09 DIAGNOSIS — K5731 Diverticulosis of large intestine without perforation or abscess with bleeding: Secondary | ICD-10-CM | POA: Diagnosis present

## 2017-12-09 DIAGNOSIS — L111 Transient acantholytic dermatosis [Grover]: Secondary | ICD-10-CM

## 2017-12-09 DIAGNOSIS — K219 Gastro-esophageal reflux disease without esophagitis: Secondary | ICD-10-CM

## 2017-12-09 DIAGNOSIS — G4730 Sleep apnea, unspecified: Secondary | ICD-10-CM | POA: Diagnosis present

## 2017-12-09 DIAGNOSIS — E785 Hyperlipidemia, unspecified: Secondary | ICD-10-CM | POA: Diagnosis present

## 2017-12-09 DIAGNOSIS — Z87891 Personal history of nicotine dependence: Secondary | ICD-10-CM | POA: Diagnosis not present

## 2017-12-09 DIAGNOSIS — K5791 Diverticulosis of intestine, part unspecified, without perforation or abscess with bleeding: Secondary | ICD-10-CM | POA: Diagnosis not present

## 2017-12-09 DIAGNOSIS — K922 Gastrointestinal hemorrhage, unspecified: Secondary | ICD-10-CM | POA: Diagnosis present

## 2017-12-09 DIAGNOSIS — K640 First degree hemorrhoids: Secondary | ICD-10-CM | POA: Diagnosis present

## 2017-12-09 LAB — CBC
HEMATOCRIT: 29.6 % — AB (ref 39.0–52.0)
Hemoglobin: 9.8 g/dL — ABNORMAL LOW (ref 13.0–17.0)
MCH: 31.8 pg (ref 26.0–34.0)
MCHC: 33.1 g/dL (ref 30.0–36.0)
MCV: 96.1 fL (ref 78.0–100.0)
Platelets: 218 10*3/uL (ref 150–400)
RBC: 3.08 MIL/uL — ABNORMAL LOW (ref 4.22–5.81)
RDW: 14.2 % (ref 11.5–15.5)
WBC: 5.2 10*3/uL (ref 4.0–10.5)

## 2017-12-09 LAB — COMPREHENSIVE METABOLIC PANEL
ALBUMIN: 2.8 g/dL — AB (ref 3.5–5.0)
ALT: 18 U/L (ref 0–44)
ANION GAP: 3 — AB (ref 5–15)
AST: 22 U/L (ref 15–41)
Alkaline Phosphatase: 53 U/L (ref 38–126)
BILIRUBIN TOTAL: 0.7 mg/dL (ref 0.3–1.2)
BUN: 18 mg/dL (ref 8–23)
CHLORIDE: 106 mmol/L (ref 98–111)
CO2: 26 mmol/L (ref 22–32)
Calcium: 8 mg/dL — ABNORMAL LOW (ref 8.9–10.3)
Creatinine, Ser: 1.26 mg/dL — ABNORMAL HIGH (ref 0.61–1.24)
GFR calc Af Amer: >60 mL/min (ref >60)
GFR, EST NON AFRICAN AMERICAN: 55 mL/min — AB (ref >60)
Glucose, Bld: 98 mg/dL (ref 70–99)
POTASSIUM: 4.3 mmol/L (ref 3.5–5.1)
Sodium: 135 mmol/L (ref 135–145)
TOTAL PROTEIN: 4.9 g/dL — AB (ref 6.5–8.1)

## 2017-12-09 LAB — HEMOGLOBIN AND HEMATOCRIT, BLOOD
HCT: 28.9 % — ABNORMAL LOW (ref 39.0–52.0)
Hemoglobin: 9.6 g/dL — ABNORMAL LOW (ref 13.0–17.0)

## 2017-12-09 MED ORDER — PEG 3350-KCL-NA BICARB-NACL 420 G PO SOLR
4000.0000 mL | Freq: Once | ORAL | Status: AC
  Administered 2017-12-09: 4000 mL via ORAL
  Filled 2017-12-09 (×2): qty 4000

## 2017-12-09 MED ORDER — SODIUM CHLORIDE 0.9 % IV SOLN
INTRAVENOUS | Status: DC
  Administered 2017-12-09 – 2017-12-10 (×2): via INTRAVENOUS

## 2017-12-09 NOTE — Progress Notes (Signed)
   Subjective: Roger Hays reports that he last experienced BRPR around 10 AM yesterday in the ED. He has not had a bowel movement since yesterday morning. He denies dizziness, SOB, and chest pain. He has not other new acute complaints.   Objective:  Vital signs in last 24 hours: Vitals:   12/08/17 1826 12/08/17 2358 12/09/17 0600 12/09/17 1017  BP: 115/72 (!) 109/58 (!) 115/54 103/61  Pulse: 64 61 65 (!) 57  Resp: 16 16 18 16   Temp: 98 F (36.7 C) 98.3 F (36.8 C) 98.2 F (36.8 C) 99.2 F (37.3 C)  TempSrc: Oral Oral Oral Oral  SpO2: 99% 98% 98% 97%  Weight:   86.8 kg   Height:       Physical Exam  Constitutional: He appears well-developed and well-nourished.  HENT:  Head: Normocephalic and atraumatic.  Eyes: EOM are normal.  Neck: Normal range of motion.  Cardiovascular: Normal rate, regular rhythm and normal heart sounds.  Pulmonary/Chest: Effort normal and breath sounds normal.  Musculoskeletal:  No LE edema bilaterally.  Neurological: He is alert.  Skin: Skin is warm and dry.  Psychiatric: He has a normal mood and affect. His behavior is normal.  Nursing note and vitals reviewed.   Assessment/Plan:  Principal Problem:   Hematochezia Active Problems:   Barrett's esophagus   Grover's disease   Acute blood loss anemia  Roger Hays is a 72 year old male with history of Barrett's esophagus, Grovers disease on methotrexate, GERD on chronic PPI's, and diverticulosis who presented yesterday with BRBPR and found to have a decreased Hb/Hct (12.6->9.8 today). His symptoms are most likely secondary to lower GI bleed secondary to diverticular bleed. An upper GI bleed is less likely given that he is on chronic Protonix and he has had regular upper EGD's to follow his Barrett's esophagus. Although he did not endorse melena this morning during my exam, he later developed brownish stool with some melenic material and weakness/dizziness. GI recommended colonoscopy due to his fairly  significant drop in hemoglobin with concomittant symptoms.  Melena with acute blood loss ischemia 1. Continue IV Pantoprazole 8 mg/hr 2. He will undergo colonoscopy prep today with colonoscopy planned for tomorrow. 3. Continue maintenance fluids 4. Repeat Hb/Hct now and then repeat every 12 hours 5. Repeat BMP tomorrow to evaluate kidney function during his hypovolemic state.  6. Remain NPO until after colonoscopy tomorrow  Dispo: Anticipated discharge in approximately 2-3 days.   Synetta Shadow, MD 12/09/2017, 2:32 PM Pager: [phone]

## 2017-12-09 NOTE — Progress Notes (Signed)
[facility] PROGRESS NOTE Subjective Patient had brownish stool with some melenic material this morning.  He is felt somewhat weak and dizzy.  His hemoglobin has dropped from 15 a month ago to 9.8 this morning.  No significant abdominal pain.  His last colonoscopy was in 2011 by Dr. Matthias Hughs revealed diverticulosis no other findings 10-year repeat was recommended.  Objective: Vital signs in last 24 hours: Temp:  [98 F (36.7 C)-99.2 F (37.3 C)] 99.2 F (37.3 C) (10/01 1017) Pulse Rate:  [57-67] 57 (10/01 1017) Resp:  [13-21] 16 (10/01 1017) BP: (103-120)/(54-85) 103/61 (10/01 1017) SpO2:  [95 %-100 %] 97 % (10/01 1017) Weight:  [86.2 kg-86.8 kg] 86.8 kg (10/01 0600) Last BM Date: 12/08/17  Intake/Output from previous day: 09/30 0701 - 10/01 0700 In: 240 [P.O.:240] Out: -  Intake/Output this shift: No intake/output data recorded.  PE:  Abdomen--good bowel sounds soft and nontender nondistended  Lab Results: Recent Labs    12/08/17 0854 12/08/17 0903 12/09/17 0703  WBC 6.1  --  5.2  HGB 12.5* 12.6* 9.8*  HCT 37.9* 37.0* 29.6*  PLT 239  --  218   BMET Recent Labs    12/08/17 0854 12/08/17 0903 12/09/17 0703  NA 138 139 135  K 4.7 4.6 4.3  CL 105 104 106  CO2 26  --  26  CREATININE 1.23 1.20 1.26*   LFT Recent Labs    12/08/17 0854 12/09/17 0703  PROT 6.2* 4.9*  AST 29 22  ALT 24 18  ALKPHOS 71 53  BILITOT 0.7 0.7   PT/INR Recent Labs    12/08/17 1503  LABPROT 14.9  INR 1.18   PANCREAS No results for input(s): LIPASE in the last 72 hours.       Studies/Results: No results found.  Medications: I have reviewed the patient's current medications.  Assessment:   1.  Lower GI bleed.  Probably diverticular and he may be stable.  He did have some discomfort which brings up the possibility of ischemic colitis.  He has had a fairly significant drop in hemoglobin and for that reason I think we are to go ahead and reevaluate his colon  since it has been 8-1/2 years.   Plan: Discussed with the patient and his wife.  We will go ahead and do a full colonoscopy prep today and plan on going ahead with a colonoscopy tomorrow.  This is been scheduled for 915.   Tresea Mall 12/09/2017, 10:53 AM  This note was created using voice recognition software. Minor errors may Have occurred unintentionally.  Pager: [phone] If no answer or after hours call [phone]

## 2017-12-09 NOTE — Progress Notes (Signed)
Pt has refused cpap for second night in a row.  Rt will monitor.

## 2017-12-10 ENCOUNTER — Encounter (HOSPITAL_COMMUNITY): Payer: Self-pay | Admitting: *Deleted

## 2017-12-10 ENCOUNTER — Inpatient Hospital Stay (HOSPITAL_COMMUNITY): Payer: Medicare Other | Admitting: Anesthesiology

## 2017-12-10 ENCOUNTER — Encounter (HOSPITAL_COMMUNITY)
Admission: EM | Disposition: A | Discharge status: Home / Self Care | Payer: Self-pay | Source: Home / Self Care | Attending: Internal Medicine

## 2017-12-10 DIAGNOSIS — K922 Gastrointestinal hemorrhage, unspecified: Secondary | ICD-10-CM

## 2017-12-10 DIAGNOSIS — K5731 Diverticulosis of large intestine without perforation or abscess with bleeding: Secondary | ICD-10-CM

## 2017-12-10 DIAGNOSIS — K5791 Diverticulosis of intestine, part unspecified, without perforation or abscess with bleeding: Secondary | ICD-10-CM

## 2017-12-10 HISTORY — PX: COLONOSCOPY WITH PROPOFOL: SHX5780

## 2017-12-10 LAB — BASIC METABOLIC PANEL
Anion gap: 8 (ref 5–15)
BUN: 13 mg/dL (ref 8–23)
CHLORIDE: 106 mmol/L (ref 98–111)
CO2: 24 mmol/L (ref 22–32)
CREATININE: 1.23 mg/dL (ref 0.61–1.24)
Calcium: 7.9 mg/dL — ABNORMAL LOW (ref 8.9–10.3)
GFR calc Af Amer: >60 mL/min (ref >60)
GFR calc non Af Amer: 57 mL/min — ABNORMAL LOW (ref >60)
GLUCOSE: 97 mg/dL (ref 70–99)
Potassium: 3.7 mmol/L (ref 3.5–5.1)
Sodium: 138 mmol/L (ref 135–145)

## 2017-12-10 LAB — HEMOGLOBIN AND HEMATOCRIT, BLOOD
HCT: 25.1 % — ABNORMAL LOW (ref 39.0–52.0)
HCT: 25.9 % — ABNORMAL LOW (ref 39.0–52.0)
HEMOGLOBIN: 8.3 g/dL — AB (ref 13.0–17.0)
Hemoglobin: 8.5 g/dL — ABNORMAL LOW (ref 13.0–17.0)

## 2017-12-10 SURGERY — COLONOSCOPY WITH PROPOFOL
Anesthesia: Monitor Anesthesia Care

## 2017-12-10 MED ORDER — PANTOPRAZOLE SODIUM 40 MG PO TBEC
40.0000 mg | DELAYED_RELEASE_TABLET | Freq: Every day | ORAL | Status: DC
  Administered 2017-12-10 – 2017-12-11 (×2): 40 mg via ORAL
  Filled 2017-12-10 (×2): qty 1

## 2017-12-10 MED ORDER — PROPOFOL 10 MG/ML IV BOLUS
INTRAVENOUS | Status: DC | PRN
  Administered 2017-12-10: 20 mg via INTRAVENOUS
  Administered 2017-12-10: 50 mg via INTRAVENOUS
  Administered 2017-12-10: 20 mg via INTRAVENOUS
  Administered 2017-12-10 (×2): 30 mg via INTRAVENOUS
  Administered 2017-12-10: 20 mg via INTRAVENOUS

## 2017-12-10 MED ORDER — PROPOFOL 500 MG/50ML IV EMUL
INTRAVENOUS | Status: DC | PRN
  Administered 2017-12-10: 50 ug/kg/min via INTRAVENOUS

## 2017-12-10 SURGICAL SUPPLY — 22 items

## 2017-12-10 NOTE — Anesthesia Postprocedure Evaluation (Signed)
Anesthesia Post Note  Patient: Roger Hays  Procedure(s) Performed: COLONOSCOPY WITH PROPOFOL (N/A )     Patient location during evaluation: PACU Anesthesia Type: MAC Level of consciousness: awake and alert Pain management: pain level controlled Vital Signs Assessment: post-procedure vital signs reviewed and stable Respiratory status: spontaneous breathing, nonlabored ventilation, respiratory function stable and patient connected to nasal cannula oxygen Cardiovascular status: stable and blood pressure returned to baseline Postop Assessment: no apparent nausea or vomiting Anesthetic complications: no    Last Vitals:  Vitals:   12/10/17 1110 12/10/17 1133  BP: (!) 109/48 117/72  Pulse: 68 60  Resp: 13 17  Temp:  36.4 C  SpO2: 96% 100%    Last Pain:  Vitals:   12/10/17 1133  TempSrc: Oral  PainSc:                  Tiajuana Amass

## 2017-12-10 NOTE — Interval H&P Note (Signed)
History and Physical Interval Note:  12/10/2017 9:59 AM  Roger Hays  has presented today for surgery, with the diagnosis of LGI bleed  The various methods of treatment have been discussed with the patient and family. After consideration of risks, benefits and other options for treatment, the patient has consented to  Procedure(s): COLONOSCOPY WITH PROPOFOL (N/A) as a surgical intervention .  The patient's history has been reviewed, patient examined, no change in status, stable for surgery.  I have reviewed the patient's chart and labs.  Questions were answered to the patient's satisfaction.     Roger Hays

## 2017-12-10 NOTE — Progress Notes (Addendum)
   Subjective: Roger Hays reports that his last episode of melena was this morning before the colonoscopy. He states that he feels fine now and does not endorse any new symptoms after his procedure. He wishes to have a regular diet. He was informed that GI would like him to stay on a liquid diet just in case he has another bleed and needs to undergo another procedure. He expresses understanding and agreement.   Objective:  Vital signs in last 24 hours: Vitals:   12/10/17 1100 12/10/17 1110 12/10/17 1133 12/10/17 1409  BP: (!) 98/48 (!) 109/48 117/72 (!) 110/58  Pulse: 87 68 60 66  Resp: 19 13 17 16   Temp:   97.6 F (36.4 C) 97.7 F (36.5 C)  TempSrc:   Oral Oral  SpO2: 95% 96% 100% 100%  Weight:      Height:       Physical Exam  Constitutional: He appears well-developed and well-nourished.  HENT:  Head: Normocephalic.  Eyes: EOM are normal.  Neck: Normal range of motion. Neck supple.  Cardiovascular: Normal rate, regular rhythm and normal heart sounds.  Pulmonary/Chest: Effort normal and breath sounds normal.  Musculoskeletal:  No LE edema noted bilaterally. No tenderness to palpation.  Neurological: He is alert.  Skin: Skin is warm and dry.  Psychiatric: He has a normal mood and affect. His behavior is normal.  Vitals reviewed.   Assessment/Plan:  Principal Problem:   Hematochezia Active Problems:   Barrett's esophagus   Grover's disease   Acute blood loss anemia  Roger Hays is a 72 year old male who presented with melena. He underwent a colonoscopy today which showed no active GI bleed but signs of recent bleed from likely diverticulosis. He is currently hemodynamically stable and has a recent Hb which remains stable at 8.5. If does not have a re-bleed, I anticipate that he will be able to be discharged tomorrow.  Melena with acute blood loss ischemia 1. Continue Pantoprazole 40 mg QD 4. Repeat Hb/Hct every 12 hours  6. Continue liquid diet for now.   Dispo:  Anticipated discharge in approximately 1 day.   Synetta Shadow, MD 12/10/2017, 4:20 PM Pager: [phone]

## 2017-12-10 NOTE — Op Note (Signed)
[facility] Patient Name: Roger Hays Procedure Date : 12/10/2017 MRN: [id number] Attending MD: Tresea Mall Dr., MD Date of Birth: 03/23/1945 CSN: [id number] Age: 72 Admit Type: Inpatient Procedure:                Colonoscopy Indications:              Hematochezia Providers:                Fayrene Fearing L. Mayte Diers Dr., MD, Zoe Lan, RN, Arlee Muslim Tech., Technician, Larina Bras, CRNA Referring MD:              Medicines:                Monitored Anesthesia Care Complications:            No immediate complications. Estimated Blood Loss:     Estimated blood loss: none. Procedure:                Pre-Anesthesia Assessment:                           - Prior to the procedure, a History and Physical                            was performed, and patient medications and                            allergies were reviewed. The patient's tolerance of                            previous anesthesia was also reviewed. The risks                            and benefits of the procedure and the sedation                            options and risks were discussed with the patient.                            All questions were answered, and informed consent                            was obtained. Prior Anticoagulants: The patient has                            taken no previous anticoagulant or antiplatelet                            agents. ASA Grade Assessment: III - A patient with                            severe systemic disease. After reviewing the risks  and benefits, the patient was deemed in                            satisfactory condition to undergo the procedure.                           After obtaining informed consent, the colonoscope                            was passed under direct vision. Throughout the                            procedure, the patient's blood pressure, pulse, and   oxygen saturations were monitored continuously. The                            [id number]-[id number] ([id number]) peds colon was introduced                            through the anus and advanced to the the cecum,                            identified by appendiceal orifice and ileocecal                            valve. The colonoscopy was performed without                            difficulty. The patient tolerated the procedure                            well. The quality of the bowel preparation was                            good. The ileocecal valve, appendiceal orifice, and                            rectum were photographed. Scope In: 10:12:48 AM Scope Out: 10:49:03 AM Scope Withdrawal Time: 0 hours 28 minutes 4 seconds  Total Procedure Duration: 0 hours 36 minutes 15 seconds  Findings:      The perianal and digital rectal examinations were normal.      The descending colon, transverse colon, ascending colon and cecum       appeared normal. No blood seen in these areas at all.      Multiple small and large-mouthed diverticula were found in the sigmoid       colon. Old coffee ground material seen. No active bleeding after       vigerous washing.      Non-bleeding internal hemorrhoids were found during retroflexion. The       hemorrhoids were medium-sized and Grade I (internal hemorrhoids that do       not prolapse). Impression:               - The descending colon, transverse colon, ascending  colon and cecum are normal.                           - Diverticulosis in the sigmoid colon.                           - Non-bleeding internal hemorrhoids.                           - No specimens collected.                           - Blood in stool without cause found on endoscopic                            exam. Bleeding most likely due to diverticulosis Recommendation:           - Return patient to hospital ward for ongoing care.                           - Clear liquid  diet. Procedure Code(s):        --- Professional ---                           [phone], Colonoscopy, flexible; diagnostic, including                            collection of specimen(s) by brushing or washing,                            when performed (separate procedure) Diagnosis Code(s):        --- Professional ---                           K92.1, Melena (includes Hematochezia)                           K57.30, Diverticulosis of large intestine without                            perforation or abscess without bleeding                           K64.0, First degree hemorrhoids CPT copyright 2017 American Medical Association. All rights reserved. The codes documented in this report are preliminary and upon coder review may  be revised to meet current compliance requirements. Tresea Mall Dr., MD 12/10/2017 11:05:21 AM This report has been signed electronically. Number of Addenda: 0

## 2017-12-10 NOTE — Transfer of Care (Signed)
Immediate Anesthesia Transfer of Care Note  Patient: Roger Hays  Procedure(s) Performed: COLONOSCOPY WITH PROPOFOL (N/A )  Patient Location: Endoscopy Unit  Anesthesia Type:MAC  Level of Consciousness: awake, alert  and oriented  Airway & Oxygen Therapy: Patient Spontanous Breathing  Post-op Assessment: Report given to RN and Post -op Vital signs reviewed and stable  Post vital signs: Reviewed and stable  Last Vitals:  Vitals Value Taken Time  BP 98/48 12/10/2017 10:57 AM  Temp 36.6 C 12/10/2017 10:57 AM  Pulse 77 12/10/2017 10:57 AM  Resp 17 12/10/2017 10:57 AM  SpO2 96 % 12/10/2017 10:57 AM  Vitals shown include unvalidated device data.  Last Pain:  Vitals:   12/10/17 1057  TempSrc: Oral  PainSc: 0-No pain         Complications: No apparent anesthesia complications

## 2017-12-10 NOTE — Anesthesia Procedure Notes (Signed)
Procedure Name: MAC Date/Time: 12/10/2017 10:04 AM Performed by: Doran Clay, CRNA Pre-anesthesia Checklist: Patient identified, Emergency Drugs available, Suction available, Patient being monitored and Timeout performed Patient Re-evaluated:Patient Re-evaluated prior to induction Oxygen Delivery Method: Simple face mask Induction Type: IV induction Airway Equipment and Method: Bite block Placement Confirmation: positive ETCO2 Dental Injury: Teeth and Oropharynx as per pre-operative assessment

## 2017-12-10 NOTE — Anesthesia Preprocedure Evaluation (Addendum)
Anesthesia Evaluation  Patient identified by MRN, date of birth, ID band Patient awake    Reviewed: Allergy & Precautions, NPO status , Patient's Chart, lab work & pertinent test results  Airway Mallampati: II  TM Distance: >3 FB Neck ROM: Full    Dental   Pulmonary sleep apnea , former smoker,    breath sounds clear to auscultation       Cardiovascular negative cardio ROS   Rhythm:Regular Rate:Normal     Neuro/Psych negative neurological ROS     GI/Hepatic Neg liver ROS, GI bleed   Endo/Other  negative endocrine ROS  Renal/GU      Musculoskeletal  (+) Arthritis ,   Abdominal   Peds  Hematology  (+) anemia ,   Anesthesia Other Findings   Reproductive/Obstetrics                            Lab Results  Component Value Date   WBC 5.2 12/09/2017   HGB 8.3 (L) 12/10/2017   HCT 25.1 (L) 12/10/2017   MCV 96.1 12/09/2017   PLT 218 12/09/2017   Lab Results  Component Value Date   CREATININE 1.26 (H) 12/09/2017   BUN 18 12/09/2017   NA 135 12/09/2017   K 4.3 12/09/2017   CL 106 12/09/2017   CO2 26 12/09/2017    Anesthesia Physical Anesthesia Plan  ASA: III  Anesthesia Plan: MAC   Post-op Pain Management:    Induction: Intravenous  PONV Risk Score and Plan: 1 and Propofol infusion, Ondansetron and Treatment may vary due to age or medical condition  Airway Management Planned: Natural Airway and Simple Face Mask  Additional Equipment:   Intra-op Plan:   Post-operative Plan:   Informed Consent: I have reviewed the patients History and Physical, chart, labs and discussed the procedure including the risks, benefits and alternatives for the proposed anesthesia with the patient or authorized representative who has indicated his/her understanding and acceptance.     Plan Discussed with: CRNA  Anesthesia Plan Comments:         Anesthesia Quick Evaluation

## 2017-12-11 ENCOUNTER — Encounter (HOSPITAL_COMMUNITY): Payer: Self-pay | Admitting: Gastroenterology

## 2017-12-11 LAB — HEMOGLOBIN AND HEMATOCRIT, BLOOD
HCT: 24.4 % — ABNORMAL LOW (ref 39.0–52.0)
HEMOGLOBIN: 8.1 g/dL — AB (ref 13.0–17.0)

## 2017-12-11 MED ORDER — POLYETHYLENE GLYCOL 3350 17 G PO PACK: 17.0000 g | PACK | Freq: Every day | ORAL | 0 refills | Status: DC

## 2017-12-11 NOTE — Discharge Summary (Signed)
Name: Roger Hays MRN: [id number] DOB: September 29, 1945 72 y.o. PCP: Roger Hays., MD  Date of Admission: 12/08/2017  8:09 AM Date of Discharge:  Attending Physician: Roger Rung, DO  Discharge Diagnosis: 1. Lower GI bleed  Discharge Medications: Allergies as of 12/11/2017      Reactions   Oxycodone Hives   Pseudoephedrine Other (See Comments)   Unknown   Vicodin [hydrocodone-acetaminophen] Hives      Medication List    TAKE these medications   ALIGN 4 MG Caps Take 4 mg by mouth daily.   aspirin EC 81 MG tablet Take 81 mg by mouth every other day.   diclofenac sodium 1 % Gel Commonly known as:  VOLTAREN Apply 1 application topically as needed for pain. elbow   folic acid 1 MG tablet Commonly known as:  FOLVITE Take 1 mg by mouth daily.   glycopyrrolate 1 MG tablet Commonly known as:  ROBINUL Take 1 mg by mouth 2 (two) times daily as needed.   meloxicam 15 MG tablet Commonly known as:  MOBIC Take 15 mg by mouth as needed.   methotrexate 2.5 MG tablet Commonly known as:  RHEUMATREX Take 7.5 mg by mouth every Wednesday. evening   pantoprazole 40 MG tablet Commonly known as:  PROTONIX Take 40 mg by mouth daily.   polyethylene glycol packet Commonly known as:  MIRALAX / GLYCOLAX Take 17 g by mouth daily.   rosuvastatin 10 MG tablet Commonly known as:  CRESTOR Take 10 mg by mouth daily.       Disposition and follow-up:   Roger Hays was discharged from [facility] in Good condition.  At the hospital follow up visit please address:  1.  Repeat CBC: At the time of discharge, Roger Hays did not endorse melena. His Hb was stable at 8.1.   2.  Labs / imaging needed at time of follow-up: Repeat CBC per above  3.  Pending labs/ test needing follow-up: None  Follow-up Appointments: Follow-up Information    Roger Hays., MD. Schedule an appointment as soon as possible for a visit.   Specialty:  Family Medicine Why:  Please  follow-up with your primary care provider Dr. Alben Hays tomorrow to have a repeat complete blood count performed.  Contact information: [address] [place] [address] [phone]        Roger Redbird, MD Follow up.   Specialty:  Gastroenterology Why:  Dr. Donavan Hays staff will call you tomorrow to set up an appointment within 2 weeks from now. Contact information: [address] [place] [address] [phone]           Hospital Course by problem list: 1. Roger Hays presented yesterday with bright red blood per rectum and found to have a decreased Hb/Hct (12.6->9.8 today). His symptoms were felt to be most likely secondary to lower GI bleed secondary to diverticular bleed. Colonoscopy was performed and showed no active bleeding. Prior to discharge, Roger Hays denied any new episodes of bleeding or other acute symptoms. His Hb prior to discharge was stable at 8.1   Discharge Vitals:   BP 108/77 (BP Location: Right Arm)   Pulse (!) 56   Temp 98.4 F (36.9 C) (Oral)   Resp 16   Ht 5\' 9"  (1.753 m)   Wt 83.9 kg   SpO2 98%   BMI 27.32 kg/m   Pertinent Labs, Studies, and Procedures:  CBC Latest Ref Rng & Units 12/11/2017  12/10/2017 12/10/2017  WBC 4.0 - 10.5 K/uL - - -  Hemoglobin 13.0 - 17.0 g/dL 8.1(L) 8.5(L) 8.3(L)  Hematocrit 39.0 - 52.0 % 24.4(L) 25.9(L) 25.1(L)  Platelets 150 - 400 K/uL - - -   10/2 Colonoscopy findings: - The descending colon, transverse colon, ascending colon and cecum are normal. - Diverticulosis in the sigmoid colon. - Non-bleeding internal hemorrhoids. - No specimens collected. - Blood in stool without cause found on endoscopic exam. Bleeding most likely due to Diverticulosis   Signed: Synetta Shadow, MD 12/11/2017, 11:31 AM   Pager: [phone]

## 2017-12-11 NOTE — Progress Notes (Signed)
[facility] PROGRESS NOTE Subjective Patient without further bleeding.  Hemoglobin 8.1.  Still on clear liquids.  Objective: Vital signs in last 24 hours: Temp:  [97.6 F (36.4 C)-98.6 F (37 C)] 98.4 F (36.9 C) (10/03 0532) Pulse Rate:  [56-87] 56 (10/03 0532) Resp:  [13-19] 16 (10/03 0532) BP: (98-117)/(48-77) 108/77 (10/03 0532) SpO2:  [95 %-100 %] 98 % (10/03 0532) Last BM Date: 12/08/17  Intake/Output from previous day: 10/02 0701 - 10/03 0700 In: 1091 [I.V.:1091] Out: -  Intake/Output this shift: No intake/output data recorded.    Lab Results: Recent Labs    12/09/17 0703 12/09/17 1555 12/10/17 0333 12/10/17 1448 12/11/17 0310  WBC 5.2  --   --   --   --   HGB 9.8* 9.6* 8.3* 8.5* 8.1*  HCT 29.6* 28.9* 25.1* 25.9* 24.4*  PLT 218  --   --   --   --    BMET Recent Labs    12/09/17 0703 12/10/17 0333  NA 135 138  K 4.3 3.7  CL 106 106  CO2 26 24  CREATININE 1.26* 1.23   LFT Recent Labs    12/09/17 0703  PROT 4.9*  AST 22  ALT 18  ALKPHOS 53  BILITOT 0.7   PT/INR Recent Labs    12/08/17 1503  LABPROT 14.9  INR 1.18   PANCREAS No results for input(s): LIPASE in the last 72 hours.       Studies/Results: No results found.  Medications: I have reviewed the patient's current medications.  Assessment:   1.  Lower GI bleed.  At the time of colonoscopy there was some old coffee-ground material in the sigmoid with extensive diverticular disease the remainder of the colon including the right colon and transverse colon were completely free of any signs of bleeding.  The patient has Barrett's esophagus gets regular endoscopies and is on chronic PPI therapy.   Plan: I think it is okay to discharge him.  He has a friend and neighbor of Dr. Alben Spittle his PCP.  He can call them today and arrange to go in tomorrow morning to Dr. Wende Mott office to get a CBC.  I would advance his diet and have him take MiraLAX to keep his stool soft.  I  discussed with Dr. Buccini's medical assistant and they will call him tomorrow to arrange a follow-up in Dr. Buccini's office in about 2 weeks.  The patient understands he needs to keep his stool very soft and will call for any more signs of bleeding.   Tresea Mall 12/11/2017, 10:19 AM  This note was created using voice recognition software. Minor errors may Have occurred unintentionally.  Pager: [phone] If no answer or after hours call [phone]

## 2017-12-11 NOTE — Progress Notes (Deleted)
   Subjective: No acute events overnight. He denies/endorses new bleeding. Last episode was yesterday morning. He denies any other symptoms including chest pain, shortness of breath, or abdominal pain.  Objective:  Vital signs in last 24 hours: Vitals:   12/10/17 1133 12/10/17 1409 12/10/17 2112 12/11/17 0532  BP: 117/72 (!) 110/58 106/76 108/77  Pulse: 60 66 (!) 57 (!) 56  Resp: 17 16 16 16   Temp: 97.6 F (36.4 C) 97.7 F (36.5 C) 98.6 F (37 C) 98.4 F (36.9 C)  TempSrc: Oral Oral Oral Oral  SpO2: 100% 100% 99% 98%  Weight:      Height:       Physical Exam  Constitutional: He appears well-developed and well-nourished.  HENT:  Head: Normocephalic and atraumatic.  Eyes: EOM are normal.  Neck: Normal range of motion.  Cardiovascular: Normal rate, regular rhythm and normal heart sounds.  Pulmonary/Chest: Effort normal and breath sounds normal.  Abdominal: Soft. Bowel sounds are normal. There is no tenderness.  Musculoskeletal:  No LE edema bilaterally  Neurological: He is alert.  Skin: Skin is warm and dry.  Psychiatric: He has a normal mood and affect. His behavior is normal.  Vitals reviewed.   Assessment/Plan:  Principal Problem:   Hematochezia Active Problems:   Barrett's esophagus   Grover's disease   Acute blood loss anemia   Lower GI bleed   Diverticulosis of colon with hemorrhage  Mr. Oregel is a 72 year old male who presented with a diverticular bleed s/p colonoscopy which showed no active bleeding. He has not had any recent episodes of bleeding and is currently hemodynamically stable. His Hb has stabilized and is now 8.1.  Melena with acute blood loss ischemia 1. Continue Pantoprazole 40 mg QD at home 2. Recommend taking Miralax to loosen stools 2. He will follow-up with his PCP tomorrow to have CBC rechecked  Dispo: Anticipated discharge today  Synetta Shadow, MD 12/11/2017, 7:54 AM Pager: [phone]

## 2017-12-11 NOTE — Discharge Instructions (Signed)
Thank you so much for allowing [place] to care for you. Please follow-up with your primary care doctor to have your blood work rechecked. If you have another episode of bleeding please seek immediate medical care.

## 2017-12-11 NOTE — Progress Notes (Signed)
   Subjective: No acute events overnight. He denies/endorses new bleeding. Last episode was yesterday morning. He denies any other symptoms including chest pain, shortness of breath, or abdominal pain.  Objective:  Vital signs in last 24 hours: Vitals:   12/10/17 1133 12/10/17 1409 12/10/17 2112 12/11/17 0532  BP: 117/72 (!) 110/58 106/76 108/77  Pulse: 60 66 (!) 57 (!) 56  Resp: 17 16 16 16  Temp: 97.6 F (36.4 C) 97.7 F (36.5 C) 98.6 F (37 C) 98.4 F (36.9 C)  TempSrc: Oral Oral Oral Oral  SpO2: 100% 100% 99% 98%  Weight:      Height:       Physical Exam  Constitutional: He appears well-developed and well-nourished.  HENT:  Head: Normocephalic and atraumatic.  Eyes: EOM are normal.  Neck: Normal range of motion.  Cardiovascular: Normal rate, regular rhythm and normal heart sounds.  Pulmonary/Chest: Effort normal and breath sounds normal.  Abdominal: Soft. Bowel sounds are normal. There is no tenderness.  Musculoskeletal:  No LE edema bilaterally  Neurological: He is alert.  Skin: Skin is warm and dry.  Psychiatric: He has a normal mood and affect. His behavior is normal.  Vitals reviewed.   Assessment/Plan:  Principal Problem:   Hematochezia Active Problems:   Barrett's esophagus   Grover's disease   Acute blood loss anemia   Lower GI bleed   Diverticulosis of colon with hemorrhage  Mr. Voigt is a 72 year old male who presented with a diverticular bleed s/p colonoscopy which showed no active bleeding. He has not had any recent episodes of bleeding and is currently hemodynamically stable. His Hb has stabilized and is now 8.1.  Melena with acute blood loss ischemia 1. Continue Pantoprazole 40 mg QD at home 2. Recommend taking Miralax to loosen stools 2. He will follow-up with his PCP tomorrow to have CBC rechecked  Dispo: Anticipated discharge today  Augusta Hilbert M, MD 12/11/2017, 7:54 AM Pager: [phone] 

## 2017-12-13 ENCOUNTER — Other Ambulatory Visit: Payer: Self-pay

## 2017-12-13 ENCOUNTER — Observation Stay (HOSPITAL_COMMUNITY)
Admission: EM | Admit: 2017-12-13 | Discharge: 2017-12-15 | Disposition: A | Discharge status: Home / Self Care | Payer: Medicare Other | Attending: Oncology | Admitting: Oncology

## 2017-12-13 ENCOUNTER — Encounter (HOSPITAL_COMMUNITY): Payer: Self-pay | Admitting: *Deleted

## 2017-12-13 DIAGNOSIS — K5731 Diverticulosis of large intestine without perforation or abscess with bleeding: Secondary | ICD-10-CM | POA: Insufficient documentation

## 2017-12-13 DIAGNOSIS — K219 Gastro-esophageal reflux disease without esophagitis: Secondary | ICD-10-CM

## 2017-12-13 DIAGNOSIS — K2270 Barrett's esophagus without dysplasia: Secondary | ICD-10-CM | POA: Diagnosis present

## 2017-12-13 DIAGNOSIS — G4730 Sleep apnea, unspecified: Secondary | ICD-10-CM | POA: Diagnosis not present

## 2017-12-13 DIAGNOSIS — Z87891 Personal history of nicotine dependence: Secondary | ICD-10-CM | POA: Insufficient documentation

## 2017-12-13 DIAGNOSIS — Z9989 Dependence on other enabling machines and devices: Secondary | ICD-10-CM | POA: Diagnosis not present

## 2017-12-13 DIAGNOSIS — L111 Transient acantholytic dermatosis [Grover]: Secondary | ICD-10-CM

## 2017-12-13 DIAGNOSIS — E785 Hyperlipidemia, unspecified: Secondary | ICD-10-CM | POA: Diagnosis not present

## 2017-12-13 DIAGNOSIS — Z7982 Long term (current) use of aspirin: Secondary | ICD-10-CM | POA: Diagnosis not present

## 2017-12-13 DIAGNOSIS — Z888 Allergy status to other drugs, medicaments and biological substances status: Secondary | ICD-10-CM

## 2017-12-13 DIAGNOSIS — D62 Acute posthemorrhagic anemia: Secondary | ICD-10-CM | POA: Diagnosis not present

## 2017-12-13 DIAGNOSIS — K625 Hemorrhage of anus and rectum: Secondary | ICD-10-CM

## 2017-12-13 DIAGNOSIS — Z79899 Other long term (current) drug therapy: Secondary | ICD-10-CM | POA: Diagnosis not present

## 2017-12-13 DIAGNOSIS — Z885 Allergy status to narcotic agent status: Secondary | ICD-10-CM

## 2017-12-13 DIAGNOSIS — E7800 Pure hypercholesterolemia, unspecified: Secondary | ICD-10-CM | POA: Diagnosis not present

## 2017-12-13 DIAGNOSIS — K922 Gastrointestinal hemorrhage, unspecified: Secondary | ICD-10-CM | POA: Diagnosis present

## 2017-12-13 DIAGNOSIS — Z96653 Presence of artificial knee joint, bilateral: Secondary | ICD-10-CM | POA: Diagnosis not present

## 2017-12-13 LAB — COMPREHENSIVE METABOLIC PANEL
ALK PHOS: 75 U/L (ref 38–126)
ALT: 22 U/L (ref 0–44)
ANION GAP: 10 (ref 5–15)
AST: 27 U/L (ref 15–41)
Albumin: 3.4 g/dL — ABNORMAL LOW (ref 3.5–5.0)
BILIRUBIN TOTAL: 0.4 mg/dL (ref 0.3–1.2)
BUN: 12 mg/dL (ref 8–23)
CALCIUM: 8.8 mg/dL — AB (ref 8.9–10.3)
CO2: 24 mmol/L (ref 22–32)
Chloride: 105 mmol/L (ref 98–111)
Creatinine, Ser: 1.26 mg/dL — ABNORMAL HIGH (ref 0.61–1.24)
GFR calc Af Amer: >60 mL/min (ref >60)
GFR, EST NON AFRICAN AMERICAN: 55 mL/min — AB (ref >60)
Glucose, Bld: 102 mg/dL — ABNORMAL HIGH (ref 70–99)
POTASSIUM: 4.2 mmol/L (ref 3.5–5.1)
Sodium: 139 mmol/L (ref 135–145)
Total Protein: 5.7 g/dL — ABNORMAL LOW (ref 6.5–8.1)

## 2017-12-13 LAB — CBC
HCT: 28.8 % — ABNORMAL LOW (ref 39.0–52.0)
HEMOGLOBIN: 9.3 g/dL — AB (ref 13.0–17.0)
MCH: 32.1 pg (ref 26.0–34.0)
MCHC: 32.3 g/dL (ref 30.0–36.0)
MCV: 99.3 fL (ref 78.0–100.0)
Platelets: 287 10*3/uL (ref 150–400)
RBC: 2.9 MIL/uL — ABNORMAL LOW (ref 4.22–5.81)
RDW: 14.3 % (ref 11.5–15.5)
WBC: 5.6 10*3/uL (ref 4.0–10.5)

## 2017-12-13 LAB — PROTIME-INR
INR: 1.04
PROTHROMBIN TIME: 13.5 s (ref 11.4–15.2)

## 2017-12-13 LAB — TYPE AND SCREEN
ABO/RH(D): O POS
ANTIBODY SCREEN: NEGATIVE

## 2017-12-13 LAB — HEMOGLOBIN AND HEMATOCRIT, BLOOD
HCT: 26.4 % — ABNORMAL LOW (ref 39.0–52.0)
HCT: 23.7 % — ABNORMAL LOW (ref 39.0–52.0)
Hemoglobin: 8.8 g/dL — ABNORMAL LOW (ref 13.0–17.0)
Hemoglobin: 7.8 g/dL — ABNORMAL LOW (ref 13.0–17.0)

## 2017-12-13 LAB — POC OCCULT BLOOD, ED: FECAL OCCULT BLD: POSITIVE — AB

## 2017-12-13 MED ORDER — METHOTREXATE 2.5 MG PO TABS: 7.5000 mg | ORAL_TABLET | ORAL | Status: DC

## 2017-12-13 MED ORDER — FOLIC ACID 1 MG PO TABS
1.0000 mg | ORAL_TABLET | Freq: Every day | ORAL | Status: DC
  Administered 2017-12-13 – 2017-12-15 (×3): 1 mg via ORAL
  Filled 2017-12-13 (×3): qty 1

## 2017-12-13 MED ORDER — PANTOPRAZOLE SODIUM 40 MG PO TBEC
40.0000 mg | DELAYED_RELEASE_TABLET | Freq: Every day | ORAL | Status: DC
  Administered 2017-12-13 – 2017-12-15 (×3): 40 mg via ORAL
  Filled 2017-12-13 (×3): qty 1

## 2017-12-13 MED ORDER — ROSUVASTATIN CALCIUM 10 MG PO TABS
10.0000 mg | ORAL_TABLET | Freq: Every day | ORAL | Status: DC
  Administered 2017-12-13 – 2017-12-14 (×2): 10 mg via ORAL
  Filled 2017-12-13 (×3): qty 1

## 2017-12-13 MED ORDER — POLYETHYLENE GLYCOL 3350 17 G PO PACK
17.0000 g | PACK | Freq: Every day | ORAL | Status: DC
  Administered 2017-12-13 – 2017-12-15 (×3): 17 g via ORAL
  Filled 2017-12-13 (×3): qty 1

## 2017-12-13 MED ORDER — SODIUM CHLORIDE 0.9 % IV SOLN
INTRAVENOUS | Status: DC
  Administered 2017-12-13 – 2017-12-14 (×2): via INTRAVENOUS

## 2017-12-13 NOTE — H&P (Signed)
Date: 12/13/2017               Patient Name:  Roger Hays MRN: [id number]  DOB: 06/12/1945 Age / Sex: 72 y.o., male   PCP: Roger Hays., MD         Medical Service: Internal Medicine Teaching Service         Attending Physician: Dr. Bonnetta Barry att. providers found    First Contact: Dr. Julian Hy Pager: [phone]  Second Contact: Dr. Levora Dredge Pager: [phone]       After Hours (After 5p/  First Contact Pager: [phone]  weekends / holidays): Second Contact Pager: [phone]   Chief Complaint: Bright red blood per rectum  History of Present Illness: Roger Hays is a 72 year old male with a history of Barrett;s esophagus, diverticulosis, Grover's disease, and recent admission for acute lower GI bleeding who presents with a bloody bowel movement.  He was admitted from 12/08/2017-12/11/2017 with lower GI bleed. His Hb dropped from 12.6 to 9.8 throughout the first 2 days of admission.  He underwent a colonoscopy which showed no active bleeding. His Hb stabilized at 8.1 and he was discharged with strict instructions to follow-up with his PCP the next day to have his CBC re-checked.   Roger Hays reports that he was doing well after discharge until this morning when he had 4-5 bowel movements with bright red blood. He called his primary care doctor who recommended that he be seen in the ED. He denies syncope, dizziness, light-headedness. He states that he has bilateral low back pain which began this morning but he believes that this has improved.  On arrival to the ED, he was slightly hypertensive at 144/95 with pulse rate of 96. He was afebrile and was 100% SpO2 on room air. He had a normal CMP. His Hb on arrival was 9.3 (up from discharge 2 days ago: 8.1).  Meds:  Current Meds  Medication Sig  . aspirin EC 81 MG tablet Take 81 mg by mouth every other day.  . diclofenac sodium (VOLTAREN) 1 % GEL Apply 1 application topically as needed for pain. elbow  . folic acid (FOLVITE) 1 MG tablet Take 1  mg by mouth daily.  Marland Kitchen glycopyrrolate (ROBINUL) 1 MG tablet Take 1 mg by mouth 2 (two) times daily as needed (for pain).   Marland Kitchen ibuprofen (ADVIL,MOTRIN) 100 MG tablet Take 200 mg by mouth every 6 (six) hours as needed for pain or fever.  . meloxicam (MOBIC) 15 MG tablet Take 15 mg by mouth daily as needed for pain.   . methotrexate (RHEUMATREX) 2.5 MG tablet Take 7.5 mg by mouth every Wednesday. evening  . pantoprazole (PROTONIX) 40 MG tablet Take 40 mg by mouth daily.  . polyethylene glycol (MIRALAX) packet Take 17 g by mouth daily.  . Probiotic Product (ALIGN) 4 MG CAPS Take 4 mg by mouth daily.  . rosuvastatin (CRESTOR) 10 MG tablet Take 10 mg by mouth daily.     Allergies: Allergies as of 12/13/2017 - Review Complete 12/13/2017  Allergen Reaction Noted  . Oxycodone Hives 01/24/2015  . Pseudoephedrine Other (See Comments) 05/22/2015  . Vicodin [hydrocodone-acetaminophen] Hives 01/24/2015  . Oxycontin [oxycodone hcl] Hives and Rash 12/13/2017   Past Medical History:  Diagnosis Date  . GI bleeding 12/08/2017  . Sleep apnea    CPAP    Family History: Son died of esophageal cancer.  Social History: Drinks alcohol occasionally. Denies tobacco and drug use.   Review  of Systems: A complete ROS was negative except as per HPI.  Physical Exam: Blood pressure (!) 144/95, pulse 96, temperature 98.2 F (36.8 C), temperature source Oral, resp. rate 16, SpO2 96 %. Physical Exam  Constitutional: He appears well-developed and well-nourished.  HENT:  Head: Normocephalic and atraumatic.  Eyes: EOM are normal.  Neck: Normal range of motion. Neck supple.  Cardiovascular: Normal rate, regular rhythm, normal heart sounds and intact distal pulses.  Pulmonary/Chest: Effort normal and breath sounds normal.  Abdominal: Soft. Bowel sounds are normal.  Mild tenderness to palpation of suprapubic area of abdomen.  Musculoskeletal: Normal range of motion.  No LE edema noted bilaterally. No tenderness  to palpation noted.  Neurological: He is alert.  Skin: Skin is warm and dry.  Psychiatric: He has a normal mood and affect. His behavior is normal.  Nursing note and vitals reviewed.  Assessment & Plan by Problem: Principal Problem:   Diverticulosis of colon with hemorrhage Active Problems:   Barrett's esophagus   Grover's disease   Lower GI bleed  Roger Hays is a 72 year old male presented with frank blood from his rectum likely secondary to a diverticular bleed. He is currently hemodynamically stable with an acute blood loss anemia (Hb 9.3).   BRBPR and Blood Loss Anemia 1. Maintenance fluids 2. Continue home Protonix 40 mg QD 3. Continue Miralax 4. Repeat CBC tomorrow morning 5. Will consider tag red blood cell scan if he continues bleeding profusely  Grover's Disease 1. Continue home methotrexate and folic acid  Hyperlipidemia 1. Continue home rosuvastatin  Dispo: Admit patient to Observation with expected length of stay less than 2 midnights.  Signed: Synetta Shadow, MD 12/13/2017, 8:56 AM  Pager: [phone]

## 2017-12-13 NOTE — ED Triage Notes (Signed)
Pt states he had a diverticulitis bleed on Monday and had colonoscopy on Wednesday, discharged Thursday.  Got up this am had blood bowel movement marlow colored.Pt reports lower back pain and states it feels sore

## 2017-12-13 NOTE — ED Provider Notes (Signed)
[facility] EMERGENCY DEPARTMENT Provider Note   CSN: [id number] Arrival date & time: 12/13/17  0741     History   Chief Complaint Chief Complaint  Patient presents with  . Rectal Bleeding    HPI Roger Hays is a 72 y.o. male.  The history is provided by the patient and medical records. No language interpreter was used.  Rectal Bleeding  Associated symptoms: no abdominal pain and no vomiting    Roger Hays is a 72 y.o. male  with a PMH of as listed below who presents to the Emergency Department complaining of return of rectal bleeding this morning.  Patient was actually admitted for similar on September 30 where he had a colonoscopy by Dr. Randa Evens on October 2 showing diverticulosis in sigmoid colon and nonbleeding internal hemorrhoid.  No active bleeding was seen.  His rectal bleeding was thought to be secondary to diverticulosis.  He left the hospital on 10/03 and had no bleeding on the third or the fourth.  This morning, he awoke and had his first bowel movement since leaving the hospital.  He noted bright red blood.  He then had 2 subsequent bowel movements with bright red blood.  No bleeding if he is not having a bowel movement.  This is similar presentation to when he was seen on September 30.  He called his internist who recommended that he come to the emergency department.  He told him that his most recent hemoglobin in the office was 9.8 and that he would at a minimum, need his hemoglobin rechecked.  He has had no lightheadedness or syncopal episodes.  No fever.  No abdominal pain, nausea or vomiting.   Past Medical History:  Diagnosis Date  . GI bleeding 12/08/2017  . Sleep apnea    CPAP    Patient Active Problem List   Diagnosis Date Noted  . Lower GI bleed   . Diverticulosis of colon with hemorrhage   . Acute blood loss anemia 12/09/2017  . Hematochezia 12/08/2017  . Grover's disease 09/10/2017  . Allergic rhinitis 05/22/2015  . Barrett's  esophagus 05/22/2015  . CPAP/BiPAP dependent 05/22/2015  . Epididymitis 05/22/2015  . Neck pain 05/22/2015  . Observed sleep apnea 05/22/2015  . Osteoarthritis 05/22/2015  . Pure hypercholesterolemia 05/22/2015  . Vitamin D deficiency 05/22/2015  . 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 Cataract, right eye 11/04/2013  . Current use of proton pump inhibitor 08/19/2013    Past Surgical History:  Procedure Laterality Date  . APPENDECTOMY    . CARPAL TUNNEL RELEASE    . COLONOSCOPY WITH PROPOFOL N/A 12/10/2017   Procedure: COLONOSCOPY WITH PROPOFOL;  Surgeon: Carman Ching, MD;  Location: [facility] ENDOSCOPY;  Service: Endoscopy;  Laterality: N/A;  . ELBOW SURGERY    . SHOULDER SURGERY    . TOTAL KNEE ARTHROPLASTY Bilateral         Home Medications    Prior to Admission medications   Medication Sig Start Date End Date Taking? Authorizing Provider  aspirin EC 81 MG tablet Take 81 mg by mouth every other day.   Yes [provider]  diclofenac sodium (VOLTAREN) 1 % GEL Apply 1 application topically as needed for pain. elbow 11/13/17  Yes [provider]  folic acid (FOLVITE) 1 MG tablet Take 1 mg by mouth daily. 11/18/17  Yes [provider]  glycopyrrolate (ROBINUL) 1 MG tablet Take 1 mg by mouth 2 (two) times daily as needed (for pain).  11/18/17 11/18/18 Yes [provider]  ibuprofen (ADVIL,MOTRIN) 100 MG tablet Take 200 mg by mouth every 6 (six) hours as needed for pain or fever.   Yes [provider]  meloxicam (MOBIC) 15 MG tablet Take 15 mg by mouth daily as needed for pain.  11/18/17  Yes [provider]  methotrexate (RHEUMATREX) 2.5 MG tablet Take 7.5 mg by mouth every Wednesday. evening 11/18/17  Yes [provider]  pantoprazole (PROTONIX) 40 MG tablet Take 40 mg by mouth daily. 11/27/17  Yes [provider]  polyethylene glycol (MIRALAX) packet Take 17 g by mouth daily. 12/11/17  Yes Synetta Shadow, MD  Probiotic  Product (ALIGN) 4 MG CAPS Take 4 mg by mouth daily.   Yes [provider]  rosuvastatin (CRESTOR) 10 MG tablet Take 10 mg by mouth daily. 11/27/17  Yes [provider]    Family History No family history on file.  Social History Social History   Tobacco Use  . Smoking status: Former Games developer  . Smokeless tobacco: Never Used  Substance Use Topics  . Alcohol use: Yes    Alcohol/week: 0.0 standard drinks    Comment: SOCIAL  . Drug use: Never     Allergies   Oxycodone; Pseudoephedrine; Vicodin [hydrocodone-acetaminophen]; and Oxycontin [oxycodone hcl]   Review of Systems Review of Systems  Gastrointestinal: Positive for blood in stool and hematochezia. Negative for abdominal pain, constipation, diarrhea, nausea and vomiting.  All other systems reviewed and are negative.    Physical Exam Updated Vital Signs BP (!) 144/95   Pulse 96   Temp 98.2 F (36.8 C) (Oral)   Resp 16   SpO2 96%   Physical Exam  Constitutional: He is oriented to person, place, and time. He appears well-developed and well-nourished. No distress.  HENT:  Head: Normocephalic and atraumatic.  Neck: Neck supple.  Cardiovascular: Normal rate, regular rhythm and normal heart sounds.  No murmur heard. Pulmonary/Chest: Effort normal and breath sounds normal. No respiratory distress.  Abdominal: Soft. He exhibits no distension.  No abdominal tenderness.  Neurological: He is alert and oriented to person, place, and time.  Skin: Skin is warm and dry.  Nursing note and vitals reviewed.    ED Treatments / Results  Labs (all labs ordered are listed, but only abnormal results are displayed) Labs Reviewed  COMPREHENSIVE METABOLIC PANEL - Abnormal; Notable for the following components:      Result Value   Glucose, Bld 102 (*)    Creatinine, Ser 1.26 (*)    Calcium 8.8 (*)    Total Protein 5.7 (*)    Albumin 3.4 (*)    GFR calc non Af Amer 55 (*)    All other components within normal  limits  CBC - Abnormal; Notable for the following components:   RBC 2.90 (*)    Hemoglobin 9.3 (*)    HCT 28.8 (*)    All other components within normal limits  POC OCCULT BLOOD, ED - Abnormal; Notable for the following components:   Fecal Occult Bld POSITIVE (*)    All other components within normal limits  TYPE AND SCREEN    EKG None  Radiology No results found.  Procedures Procedures (including critical care time)  Medications Ordered in ED Medications - No data to display   Initial Impression / Assessment and Plan / ED Course  I have reviewed the triage vital signs and the nursing notes.  Pertinent labs & imaging results that were available during my care of the patient  were reviewed by me and considered in my medical decision making (see chart for details).    Roger Hays is a 72 y.o. male who presents to ED for bright red blood in the stool that began today. He does have history of similar: chart reviewed extensively. He was admitted by IMTS on 9/30 for similar. Ended up having colonoscopy by Dr. Randa Evens showing reticulosis in the sigmoid colon, nonbleeding internal hemorrhoids.  No active bleeding was noted, but events were thought to be secondary to diverticular disease.  He was discharged home on 10/03.  No bleeding on the third or the fourth.  He had bright red blood in 3 bowel movements this morning, prompting him to come to the emergency department.  Last hemoglobin was 9.8.  Today, this is dropped to 9.3.  Hemoccult positive and did have bright red blood on rectal exam.  I spoke with GI, Dr. Ewing Schlein, who recommends keeping on clear liquid diet and observe with serial hemoglobin checks.  He states that they may consider a nuclear bleeding scan if symptoms worsen.  Recommends medicine admission and they can call for formal consult if they deem necessary.  IM teaching service consulted who will admit.  Patient discussed with Dr. Jacqulyn Bath who agrees with treatment plan.     Final Clinical Impressions(s) / ED Diagnoses   Final diagnoses:  Rectal bleeding    ED Discharge Orders    None       Amadou Katzenstein, Chase Picket, PA-C 12/13/17 [id number]    Maia Plan, MD 12/13/17 1945

## 2017-12-13 NOTE — Progress Notes (Signed)
Provider notified of new Hgb 7.8; pt not symptomatic.

## 2017-12-14 DIAGNOSIS — K5731 Diverticulosis of large intestine without perforation or abscess with bleeding: Secondary | ICD-10-CM | POA: Diagnosis not present

## 2017-12-14 DIAGNOSIS — D62 Acute posthemorrhagic anemia: Secondary | ICD-10-CM | POA: Diagnosis not present

## 2017-12-14 LAB — HEMOGLOBIN AND HEMATOCRIT, BLOOD
HEMATOCRIT: 23.4 % — AB (ref 39.0–52.0)
HEMATOCRIT: 21.5 % — AB (ref 39.0–52.0)
HEMOGLOBIN: 7.7 g/dL — AB (ref 13.0–17.0)
HEMOGLOBIN: 7.1 g/dL — AB (ref 13.0–17.0)

## 2017-12-14 LAB — CBC
HCT: 22.4 % — ABNORMAL LOW (ref 39.0–52.0)
HEMOGLOBIN: 7.4 g/dL — AB (ref 13.0–17.0)
MCH: 32 pg (ref 26.0–34.0)
MCHC: 33 g/dL (ref 30.0–36.0)
MCV: 97 fL (ref 78.0–100.0)
PLATELETS: 238 10*3/uL (ref 150–400)
RBC: 2.31 MIL/uL — AB (ref 4.22–5.81)
RDW: 14.6 % (ref 11.5–15.5)
WBC: 5.7 10*3/uL (ref 4.0–10.5)

## 2017-12-14 MED ORDER — SODIUM CHLORIDE 0.9 % IV SOLN
25.0000 mg | Freq: Once | INTRAVENOUS | Status: AC
  Administered 2017-12-14: 25 mg via INTRAVENOUS
  Filled 2017-12-14: qty 0.5

## 2017-12-14 MED ORDER — SODIUM CHLORIDE 0.9 % IV SOLN
500.0000 mg | Freq: Once | INTRAVENOUS | Status: AC
  Administered 2017-12-14: 500 mg via INTRAVENOUS
  Filled 2017-12-14: qty 10

## 2017-12-14 MED ORDER — SODIUM CHLORIDE 0.9 % IV SOLN: 500.0000 mg | Freq: Once | INTRAVENOUS | Status: DC

## 2017-12-14 NOTE — Progress Notes (Addendum)
   Subjective: Roger Hays denies any new episodes of bloody bowel movements since yesterday morning in the ED.  He denies any chest pain, abdominal pain, shortness of breath.  Given his dropping hemoglobin we are concerned about the potential for cardiac strain.  We recommended infusing 1 unit of packed red blood cells.  Roger Hays expresses concern about potential risk of infection.  We educated him on the low risk.  He states that he would like to wait until the repeat hemoglobin this afternoon to decide on whether he will accept blood products.  He is in agreement with IV iron.   Objective:  Vital signs in last 24 hours: Vitals:   12/13/17 1022 12/13/17 1456 12/13/17 2208 12/14/17 0523  BP: (!) 134/117 126/77 128/79 111/67  Pulse: 83 74 66 72  Resp: 16 16 18 17   Temp: 98 F (36.7 C) 98.4 F (36.9 C) 98.7 F (37.1 C) 98.7 F (37.1 C)  TempSrc: Oral Oral Oral Oral  SpO2: 99% 100% 100% 99%  Weight: 86.2 kg     Height: 5\' 9"  (1.753 m)      Physical Exam  Constitutional: He is oriented to person, place, and time and well-developed, well-nourished, and in no distress.  Eyes: Pupils are equal, round, and reactive to light. EOM are normal.  Cardiovascular: Normal rate.  Pulmonary/Chest: Effort normal and breath sounds normal.  Abdominal: Soft. Bowel sounds are normal. There is no tenderness.  Musculoskeletal: Normal range of motion.  No LE edema noted  Neurological: He is alert and oriented to person, place, and time.  Skin: Skin is warm and dry.  Psychiatric: Affect normal.  Nursing note and vitals reviewed.   Assessment/Plan:  Principal Problem:   Diverticulosis of colon with hemorrhage Active Problems:   Barrett's esophagus   Grover's disease   Lower GI bleed  Roger Hays is a 72 year old male presented with frank blood from his rectum likely secondary to a diverticular bleed. He is hemodynamically stable and continues to have an acute blood loss anemia (today 7.4 down from 9.3  on presentation). His last bloody bowel movement was yesterday morning. I am concerned for his borderline low hemoglobin and potential for further anemia causing heart strain. Roger Hays would like to wait until we have the repeat hemoglobin this afternoon to decide on blood products.  In the meantime we will start IV iron.  BRBPR and Blood Loss Anemia 1. Will give 500 mg IV iron infusion 2. Continue Maintenance fluids 3. Continue home Protonix 40 mg QD 4. Continue Miralax 5. Repeat CBC this afternoon 6.  If his hemoglobin drops below 7 we will transfuse packed red blood cells 7. Switch diet to regular Hb. I do not foresee the gastroenterologist performing another colonoscopy in the near future.  Dispo: Anticipated discharge today if his hemoglobin stabilizes and he does not have any more instances of melena.  Synetta Shadow, MD 12/14/2017, 10:36 AM Pager: [phone]

## 2017-12-14 NOTE — Plan of Care (Signed)
  Problem: Education: Goal: Knowledge of General Education information will improve Description: Including pain rating scale, medication(s)/side effects and non-pharmacologic comfort measures Outcome: Progressing   Problem: Activity: Goal: Risk for activity intolerance will decrease Outcome: Progressing   Problem: Coping: Goal: Level of anxiety will decrease Outcome: Progressing   

## 2017-12-15 DIAGNOSIS — K5731 Diverticulosis of large intestine without perforation or abscess with bleeding: Secondary | ICD-10-CM | POA: Diagnosis not present

## 2017-12-15 DIAGNOSIS — Z79899 Other long term (current) drug therapy: Secondary | ICD-10-CM | POA: Diagnosis not present

## 2017-12-15 DIAGNOSIS — D62 Acute posthemorrhagic anemia: Secondary | ICD-10-CM | POA: Diagnosis not present

## 2017-12-15 DIAGNOSIS — Z885 Allergy status to narcotic agent status: Secondary | ICD-10-CM | POA: Diagnosis not present

## 2017-12-15 LAB — HEMOGLOBIN AND HEMATOCRIT, BLOOD
HCT: 23.5 % — ABNORMAL LOW (ref 39.0–52.0)
Hemoglobin: 7.7 g/dL — ABNORMAL LOW (ref 13.0–17.0)

## 2017-12-15 NOTE — Discharge Instructions (Signed)
Glucose allowing [place] to care for you during your admission.  Please follow-up with Dr. Alben Spittle within the week to have your complete blood count repeated. Please also follow-up with your gastroenterologist Dr. Matthias Hughs.    If you have another episode of bleeding please seek immediate medical care.

## 2017-12-15 NOTE — Discharge Summary (Signed)
Name: Roger Hays MRN: [id number] DOB: 02-23-1946 72 y.o. PCP: Elijio Miles., MD  Date of Admission: 12/13/2017  7:45 AM Date of Discharge: 12/15/2017 Attending Physician: Dr. Carlynn Purl  Discharge Diagnosis: 1.  Acute blood loss anemia secondary to diverticulosis  Discharge Medications: Allergies as of 12/15/2017      Reactions   Oxycodone Hives   Pseudoephedrine Other (See Comments)   Unknown   Vicodin [hydrocodone-acetaminophen] Hives   Oxycontin [oxycodone Hcl] Hives, Rash      Medication List    TAKE these medications   ALIGN 4 MG Caps Take 4 mg by mouth daily.   aspirin EC 81 MG tablet Take 81 mg by mouth every other day.   diclofenac sodium 1 % Gel Commonly known as:  VOLTAREN Apply 1 application topically as needed for pain. elbow   folic acid 1 MG tablet Commonly known as:  FOLVITE Take 1 mg by mouth daily.   glycopyrrolate 1 MG tablet Commonly known as:  ROBINUL Take 1 mg by mouth 2 (two) times daily as needed (for pain).   ibuprofen 100 MG tablet Commonly known as:  ADVIL,MOTRIN Take 200 mg by mouth every 6 (six) hours as needed for pain or fever.   meloxicam 15 MG tablet Commonly known as:  MOBIC Take 15 mg by mouth daily as needed for pain.   methotrexate 2.5 MG tablet Commonly known as:  RHEUMATREX Take 7.5 mg by mouth every Wednesday. evening   pantoprazole 40 MG tablet Commonly known as:  PROTONIX Take 40 mg by mouth daily.   polyethylene glycol packet Commonly known as:  MIRALAX / GLYCOLAX Take 17 g by mouth daily.   rosuvastatin 10 MG tablet Commonly known as:  CRESTOR Take 10 mg by mouth daily.       Disposition and follow-up:   RogerEdmundo SHERRICK Hays was discharged from [facility] in Stable condition.  At the hospital follow up visit please address:  1.  Repeat CBC: At the time of discharge, Roger Hays did not endorse melena.  His hemoglobin was stable at 7.7.  2.  Labs / imaging needed at time of  follow-up: Repeat CBC  3.  Pending labs/ test needing follow-up: None  Follow-up Appointments: Follow-up Information    Elijio Miles., MD. Call.   Specialty:  Family Medicine Why:  Please call your primary care doctor to make an appointment to have your blood count rechecked by the end of the week. Contact information: [address] [place] [address] [phone]        Bernette Redbird, MD. Go on 12/22/2017.   Specialty:  Gastroenterology Why:  Please ensure that you follow-up with your appointment with Dr. Matthias Hughs. Contact information: [address] [place] [address] [phone]           Hospital Course by problem list: 1.  Acute blood loss anemia secondary to likely diverticular bleed: [name] presented with melena and found to have acute blood loss anemia of 9.3.  He had several episodes of witnessed bloody bowel movements in the emergency department.  His vital signs were within normal limits and he was otherwise asymptomatic. During this admission his hemoglobin down trended to 7.1.  We were concerned about his low blood count and potential for heart strain.  We recommended giving Mr. Lebeau blood products and he declined.  We were able to infuse IV iron during his stay.  Prior to discharge he did not have  any new episodes of melena and his hemoglobin remained stable at 7.7.  His vital signs have remained within normal limits.  He was discharged in stable condition.  He was recommended to follow-up with his primary care doctor to have his complete blood count repeated in the next week.  He states that he has an appointment on Monday, October 14 with his regular gastroenterologist.  He was asked to seek immediate care if he has any new episodes of bloody bowel movements.  He expressed agreement and understanding.  Discharge Vitals:   BP 113/75 (BP Location: Right Arm)   Pulse 60   Temp 98.6 F (37 C) (Oral)   Resp 16   Ht 5\' 9"  (1.753 m)    Wt 86.2 kg   SpO2 97%   BMI 28.06 kg/m   Pertinent Labs, Studies, and Procedures:  CBC Latest Ref Rng & Units 12/15/2017 12/14/2017 12/14/2017  WBC 4.0 - 10.5 K/uL - - -  Hemoglobin 13.0 - 17.0 g/dL 7.7(L) 7.1(L) 7.7(L)  Hematocrit 39.0 - 52.0 % 23.5(L) 21.5(L) 23.4(L)  Platelets 150 - 400 K/uL - - -   Signed: Synetta Shadow, MD 12/15/2017, 12:57 PM   Pager: [phone]

## 2017-12-15 NOTE — Plan of Care (Signed)

## 2017-12-15 NOTE — Progress Notes (Signed)
Roger Hays to be D/C'd  per MD order. Discussed with the patient and all questions fully answered.  VSS, Skin clean, dry and intact without evidence of skin break down, no evidence of skin tears noted.  IV catheter discontinued intact. Site without signs and symptoms of complications. Dressing and pressure applied.  An After Visit Summary was printed and given to the patient. Patient received prescription.  D/c education completed with patient/family including follow up instructions, medication list, d/c activities limitations if indicated, with other d/c instructions as indicated by MD - patient able to verbalize understanding, all questions fully answered.   Patient instructed to return to ED, call 911, or call MD for any changes in condition.   Patient to be escorted via WC, and D/C home via private auto.

## 2017-12-15 NOTE — Progress Notes (Signed)
   Subjective: Mr. Glazier denies any recent episodes of blood in his bowel movements.  He has not had a bowel movement in 2 days since his arrival at the emergency department.  He denies any new or acute symptoms such as shortness of breath, chest pain, abdominal pain.    Objective:  Vital signs in last 24 hours: Vitals:   12/14/17 0523 12/14/17 1505 12/14/17 2139 12/15/17 0547  BP: 111/67 109/86 113/70 113/75  Pulse: 72 64 (!) 57 60  Resp: 17 18 18 16   Temp: 98.7 F (37.1 C) 98.1 F (36.7 C) 98.3 F (36.8 C) 98.6 F (37 C)  TempSrc: Oral Oral Oral Oral  SpO2: 99% 99% 96% 97%  Weight:      Height:       Physical Exam  Constitutional: He is oriented to person, place, and time. He appears well-developed and well-nourished.  HENT:  Head: Normocephalic and atraumatic.  Eyes: EOM are normal.  Neck: Normal range of motion.  Cardiovascular: Normal rate, regular rhythm and normal heart sounds.  Pulmonary/Chest: Effort normal and breath sounds normal.  Abdominal: Soft. Bowel sounds are normal.  Musculoskeletal:  No lower extremity or tenderness noted bilaterally.  Neurological: He is alert and oriented to person, place, and time.  Skin: Skin is warm and dry.  Psychiatric: He has a normal mood and affect. His behavior is normal.  Nursing note and vitals reviewed.   Assessment/Plan:  Principal Problem:   Diverticulosis of colon with hemorrhage Active Problems:   Barrett's esophagus   Grover's disease   Lower GI bleed  Mr. Lamountain is a 72 year old male presented with melena likely secondary to a diverticular bleed s/p IV iron. He is hemodynamically stable and hemoglobin is stable at 7.7 this morning. His last bloody bowel movement was 2 days ago.  He will be discharged today and will follow up with his regular gastroenterologist on Monday, October 14. I recommend that he also follow-up with his primary care doctor by the end of the week to repeat a complete blood count.  BRBPR and  Blood Loss Anemia 1. Continue home Protonix 40 mg QD 2. Continue Miralax 3.  Discharge home today  Dispo: Anticipated discharge today.  Synetta Shadow, MD 12/15/2017, 11:23 AM Pager: [phone]

## 2017-12-16 ENCOUNTER — Ambulatory Visit: Payer: Medicare Other | Admitting: Cardiology

## 2017-12-31 ENCOUNTER — Other Ambulatory Visit: Payer: Self-pay | Admitting: Cardiology

## 2017-12-31 DIAGNOSIS — E785 Hyperlipidemia, unspecified: Secondary | ICD-10-CM

## 2017-12-31 MED ORDER — ROSUVASTATIN CALCIUM 20 MG PO TABS *I*
20.0000 mg | ORAL_TABLET | Freq: Every day | ORAL | 3 refills | Status: DC
Start: 2017-12-31 — End: 2018-12-28

## 2017-12-31 NOTE — Telephone Encounter (Signed)
Patient will be in town sometime in November, once he knows when, he will call for an appointment.

## 2018-04-06 ENCOUNTER — Other Ambulatory Visit: Payer: Self-pay | Admitting: Cardiology

## 2018-04-06 DIAGNOSIS — I2510 Atherosclerotic heart disease of native coronary artery without angina pectoris: Secondary | ICD-10-CM

## 2018-04-07 ENCOUNTER — Ambulatory Visit: Payer: BLUE CROSS/BLUE SHIELD | Attending: Cardiology | Admitting: Cardiology

## 2018-04-07 ENCOUNTER — Other Ambulatory Visit
Admission: RE | Admit: 2018-04-07 | Discharge: 2018-04-07 | Disposition: A | Discharge status: Home / Self Care | Payer: Medicare Other | Source: Ambulatory Visit | Attending: Cardiology | Admitting: Cardiology

## 2018-04-07 ENCOUNTER — Encounter: Payer: Self-pay | Admitting: Cardiology

## 2018-04-07 ENCOUNTER — Other Ambulatory Visit: Payer: Self-pay | Admitting: Cardiology

## 2018-04-07 VITALS — BP 114/70 | HR 65 | Ht 72.0 in | Wt 142.0 lb

## 2018-04-07 DIAGNOSIS — I2510 Atherosclerotic heart disease of native coronary artery without angina pectoris: Secondary | ICD-10-CM | POA: Insufficient documentation

## 2018-04-07 DIAGNOSIS — Z9189 Other specified personal risk factors, not elsewhere classified: Secondary | ICD-10-CM

## 2018-04-07 DIAGNOSIS — E785 Hyperlipidemia, unspecified: Secondary | ICD-10-CM | POA: Insufficient documentation

## 2018-04-07 LAB — COMPREHENSIVE METABOLIC PANEL
ALT: 57 U/L — ABNORMAL HIGH (ref 0–50)
AST: 31 U/L (ref 0–50)
Albumin: 4.4 g/dL (ref 3.5–5.2)
Alk Phos: 67 U/L (ref 40–130)
Anion Gap: 9 (ref 7–16)
Bilirubin,Total: 0.5 mg/dL (ref 0.0–1.2)
CO2: 26 mmol/L (ref 20–28)
Calcium: 9.2 mg/dL (ref 8.6–10.2)
Chloride: 105 mmol/L (ref 96–108)
Creatinine: 0.94 mg/dL (ref 0.67–1.17)
GFR,Black: 93 *
GFR,Caucasian: 80 *
Glucose: 85 mg/dL (ref 60–99)
Lab: 16 mg/dL (ref 6–20)
Potassium: 4.6 mmol/L (ref 3.3–5.1)
Sodium: 140 mmol/L (ref 133–145)
Total Protein: 6.7 g/dL (ref 6.3–7.7)

## 2018-04-07 LAB — CBC AND DIFFERENTIAL
Baso # K/uL: 0 10*3/uL (ref 0.0–0.1)
Basophil %: 0.4 %
Eos # K/uL: 0.1 10*3/uL (ref 0.0–0.5)
Eosinophil %: 1.8 %
Hematocrit: 48 % (ref 40–51)
Hemoglobin: 15.6 g/dL (ref 13.7–17.5)
IMM Granulocytes #: 0 10*3/uL
IMM Granulocytes: 0.2 %
Lymph # K/uL: 0.7 10*3/uL — ABNORMAL LOW (ref 1.3–3.6)
Lymphocyte %: 14.8 %
MCH: 31 pg/cell (ref 26–32)
MCHC: 33 g/dL (ref 32–37)
MCV: 94 fL — ABNORMAL HIGH (ref 79–92)
Mono # K/uL: 0.4 10*3/uL (ref 0.3–0.8)
Monocyte %: 9 %
Neut # K/uL: 3.6 10*3/uL (ref 1.8–5.4)
Nucl RBC # K/uL: 0 10*3/uL (ref 0.0–0.0)
Nucl RBC %: 0 /100 WBC (ref 0.0–0.2)
Platelets: 222 10*3/uL (ref 150–330)
RBC: 5.1 MIL/uL (ref 4.6–6.1)
RDW: 12.9 % (ref 11.6–14.4)
Seg Neut %: 73.8 %
WBC: 4.9 10*3/uL (ref 4.2–9.1)

## 2018-04-07 LAB — LIPID PANEL
Chol/HDL Ratio: 1.6
Cholesterol: 175 mg/dL
HDL: 108 mg/dL — ABNORMAL HIGH (ref 40–60)
LDL Calculated: 49 mg/dL
Non HDL Cholesterol: 67 mg/dL
Triglycerides: 91 mg/dL

## 2018-04-07 LAB — TSH: TSH: 1.43 u[IU]/mL (ref 0.27–4.20)

## 2018-04-07 LAB — HIGH SENSITIVITY CRP: CRP,High Sensitivity: 1.2 mg/L

## 2018-04-07 NOTE — Patient Instructions (Signed)
..............................................................................................................       Plan:     Our goals now:   .............................................................................................................Marland Kitchen    # Congratulations:   - Not smoking   - Physically active   - Plant based diet   - Weight control      # Continue current medications  - Crestor 20 mg every day   - Aspirin 81 mg every day   - Mg oxide 250 mg every day     # Lifestyle                 Heart healthful whole food plant based diet   Weight: Body mass index is 19.8 kg/(m^2).     - Current status and targets for weight management reviewed.     -Exercise    - Walk 6-10,000 steps every day year round    - Isometric: Aim for 10 - 15 reps to achieve fatigue upper and lower body; rest  and repeat. Do every other day.      # Testing    - -- Tc-58m pyrophosphate CZT SPECT scan to evaluate cardiac amyloidosis.    -- Serum kappa / lambda light chain immunofixation to rule out AL amyloidosis   - Lipid panel, hs-CRP, Metabolic Profile, HbA1C, TSH: obtain today and 1- 2 weeks prior to return to clinic.   - Echocardiogram   - ECG next visit.     # Follow-up:   - Your primary care physician (PCP), as scheduled.     - Cardiology: Return to clinic in 12 months.       Macky Lower Hermelinda Medicus MD MS, [facility]ial Millman [facility]   Cardiology Attending   Professor of Medicine and Imaging Sciences  [facility] Clinical Cardiology Group     Questions, results? Sign up for MyChart - ask how at front desk.    Admin Assistant: Tacy Learn tel: [phone]   .............................................................................................................Marland Kitchen

## 2018-04-07 NOTE — Progress Notes (Addendum)
Cardiology Office Revisit Note    Date of Visit: 04/07/2018 Patient: Terry Grant   Patients PCP: Cindra Eves, MD Patient DOB: 08/19/1945     Subjective/Reason For Visit     I had the pleasure of seeing Terry Grant in cardiology followup on 04/07/2018. Last visit was 1 year ago.    Interval history:     Cardiovascular symptoms:   Asymptomatic     Smoking: smoking     Medications: Reviewed.     Diet: Reviewed in detail. Plant based dietary recommendations reviewed in detail.   95% fish for protein   Meatless days     Caffeine: Green tea avoid sugar     Etoh: Rarely     Exercise: Reviewed.     Sleep: Reviewed. Benadyl never       Past Medical History:   Diagnosis Date    ERM OD (epiretinal membrane, right eye)     Supraventricular Tachycardia 10/06/2008    Created by Conversion     Visual discomfort of right eye     blurry vision - 73yrs     Past Surgical History:   Procedure Laterality Date    CONVERTED PROCEDURE      Colon Surgery Conversion Data      ROS Review of Systems  Constitutional: Appetite good, no fevers, night sweats or weight loss  HEENT: Unremarkable   Breast: No mass, tenderness or discharge.   Respiratory: No cough, wheezing or dyspnea. Does not snore or have apneic episodes.   CV: See HPI. No chest pain, shortness of breath or peripheral edema  GI: No GERD. No nausea/vomiting, or change in bowel habits. Does note abdominal pains in lower abdomen and back. Groins pains, no hernia.   GU: No dysuria, urgency or incontinence;   Musculoskeletal: No bony or muscular tenderness.   Neuro: No MS changes, no motor weakness, no sensory changes   Endocrine: No thyroid disease or diabetes mellitus.   Allergy / Hematology / Rheumatology: Allergies: See below. No dry mouth, dry eyes.    Psychiatric: No mood or thought issues.   Skin: Unremarkable.       Medications     Current Outpatient Medications   Medication Sig    rosuvastatin (CRESTOR) 20 MG tablet Take 1 tablet (20 mg total) by mouth daily     aspirin 81 MG EC tablet Take 1 tablet (81 mg total) by mouth daily    Magnesium 250 MG TABS      Vitals and Physical Exam     Terry Grant  height is 1.829 m (6') and weight is 64.4 kg (142 lb). His blood pressure is 114/70 and his pulse is 65.  Body mass index is 19.26 kg/m.    Physical Exam   Exam:     Visit Vitals  BP 114/70 (BP Location: Right arm, Patient Position: Sitting, Cuff Size: adult)   Pulse 65   Ht 1.829 m (6')   Wt 64.4 kg (142 lb)   BMI 19.26 kg/m      Neck: No JVD, no carotid bruit   Lungs clear to P&A   Cor S1S2 without gallop, murmur   Pulses: intact 4+ pulses, symmetric    Abdomen: non-tender, no mass or organomegaly   Extremities: no cyanosis, clubbing edema, cellulitis, calf tenderness, or Homan's sign.    Neuro: A&Ox3 grossly normal     Laboratory Data     Hematology:   Results in Past 730 Days  Result Component Current Result Previous  Result   WBC 5.5 (12/16/2016) Not in Time Range   Hemoglobin 16.2 (12/16/2016) Not in Time Range   Hematocrit 48 (12/16/2016) Not in Time Range   Platelets 192 (12/16/2016) Not in Time Range     Chemistry:   Results in Past 730 Days  Result Component Current Result Previous Result   Sodium 141 (12/16/2016) Not in Time Range   Potassium 4.6 (12/16/2016) Not in Time Range   Creatinine 0.94 (12/16/2016) Not in Time Range   Glucose 94 (12/16/2016) Not in Time Range   Calcium 9.2 (12/16/2016) Not in Time Range   Hemoglobin A1C 5.4 (12/16/2016) Not in Time Range   AST 37 (12/16/2016) Not in Time Range   ALT 75 (H) (12/16/2016) Not in Time Range   TSH 2.45 (12/16/2016) Not in Time Range     Coagulation Studies:   No results found for requested labs within last 730 days.     Cardiac:   No results found for requested labs within last 730 days.     Lipids:   Results in Past 730 Days  Result Component Current Result Previous Result   Cholesterol 182 (12/16/2016) Not in Time Range   HDL 109 (12/16/2016) Not in Time Range   Triglycerides 62 (12/16/2016) Not in Time Range   LDL Calculated 61  (12/16/2016) Not in Time Range   Chol/HDL Ratio 1.7 (12/16/2016) Not in Time Range   CRP,High Sensitivity 0.4 (12/16/2016) Not in Time Range     Cardiac/Imaging Data & Risk Scores     ECG:                              Impression and Plan     Patient Active Problem List   Diagnosis Code    Other specified cardiac dysrhythmias I49.8    CAD (coronary artery disease),Multiple stents RCA Nov 2009 [facility]  I25.10    Coronary atherosclerosis I25.10    Dyslipidemia E78.5    Visual discomfort of right eye H53.141    Epiretinal membrane, right eye H35.371    Horseshoe retinal tear, 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   - Advised to restart ASA at 81 mg every other day   - On avodart, no further pain urinating   - Continue Crestor 20 mg every day   - Taper and discontinue Toprol XL as indicated below     # Bifascicular block RBBB + LPHB   Asyhmptomatic   Lumbar stenosis   Consider cardiac amyloidosis     # Hx Bleeding on Antiplatelet Medications   Terry Grant has stopped his clopidogrel and aspirin because of easy bleeding, which he did not report to me nor did he admit to when I inquired about any new medical issues.   MATCH Trial has demonstrated benefit of Plavix and bleeding risk of aspirin.   After discussion, he refuses clopidogrel I have recommended.  Taking ASA 81 mg every day      # Dyslipidemia   - 2013 ACC AHA guidelines for patient with CAD, increasedto 20 mg every day   - On crestor 20, favorable lipid panel   - Repeat blood work today     # NKDA   Seasonal allergies     # Psychosocial   Terry Grant   Cell:  [phone]   - Attorney   Lives with girlfiend   Divorced   [facility] [place]   [place] vegan   Terry MaladySon Terry Grant [facility] RFA for ventricular ectopy Terry Grant   Exercises, diet     I appreciate the opportunity to participate in the care of your patient.     Please do not hesitate to contact me for any questions or concerns.       ..............................................................................................................      ..............................................................................................................    Plan:     Our goals now:   .............................................................................................................Marland Kitchen.    # Congratulations:   - Not smoking   - Physically active   - Plant based diet   - Weight control      # Continue current medications  - Crestor 20 mg every day   - Aspirin 81 mg every day   - Mg oxide 250 mg every day     # Lifestyle                 Heart healthful whole food plant based diet   Weight: Body mass index is 19.8 kg/(m^2).     - Current status and targets for weight management reviewed.     -Exercise    - Walk 6-10,000 steps every day year round    - Isometric: Aim for 10 - 15 reps to achieve fatigue upper and lower body; rest  and repeat. Do every other day.      # Testing    - -- Tc-3850m pyrophosphate CZT SPECT scan to evaluate cardiac amyloidosis.    -- Serum kappa / lambda light chain immunofixation to rule out AL amyloidosis   - Lipid panel, hs-CRP, Metabolic Profile, HbA1C, TSH: obtain today and 1- 2 weeks prior to return to clinic.   - Echocardiogram   - ECG next visit.     # Follow-up:   - Your primary care physician (PCP), as scheduled.     - Cardiology: Return to clinic in 12 months.     Addendum 04/13/2018: [facility] preventive [facility]ologists recommended by Dr. Fabio Neighborshareontaitawee:   Drs. Ana ElginSvatikova or [place] Fernandes.     Macky Loweronald G. Hermelinda MedicusSchwartz MD MS, [facility]manFAHA ABNM [facility]    Cardiology Attending   Professor of Medicine and Imaging Sciences  [facility]     Questions, results? Sign up for MyChart - ask how at front desk.    Admin Assistant: Tacy LearnAmy Gregory tel: [phone]   .............................................................................................................Marland Kitchen.         Joline MaxcyONALD Jyden Kromer, MD  Electronically signed on 04/07/2018 at 2:07 PM.

## 2018-04-08 ENCOUNTER — Other Ambulatory Visit: Payer: Self-pay | Admitting: Cardiology

## 2018-04-08 NOTE — Progress Notes (Signed)
lpa

## 2018-04-09 LAB — LIPOPROTEIN A (LPA): Lipoprotein a: 102 mg/dL — ABNORMAL HIGH (ref <=29)

## 2018-04-10 ENCOUNTER — Telehealth: Payer: BLUE CROSS/BLUE SHIELD | Admitting: Cardiology

## 2018-04-10 NOTE — Telephone Encounter (Signed)
Cardiology Attending Telephone Consultation Note      Lpa >100 reviewed with Rosanne Ashing, whose son also has Lpa    Jim's son is looking for preventive cardiologist in [place], [place] - will check with contacts at [facility] ([place])     Macky Lower. Hermelinda Medicus MD MS, [facility] Nathanial Millman [facility]   Cardiology Attending   Professor of Medicine and Imaging Sciences  [facility] Clinical Cardiology Group

## 2018-04-13 ENCOUNTER — Encounter: Payer: Self-pay | Admitting: Cardiology

## 2018-04-21 ENCOUNTER — Ambulatory Visit: Payer: Medicare Other

## 2018-06-08 LAB — EKG 12-LEAD
P: 83 deg
PR: 156 ms
QRS: 103 deg
QRSD: 136 ms
QT: 416 ms
QTc: 426 ms
Rate: 63 {beats}/min
T: 155 deg

## 2018-12-25 ENCOUNTER — Other Ambulatory Visit: Payer: Self-pay | Admitting: Cardiology

## 2018-12-25 DIAGNOSIS — E785 Hyperlipidemia, unspecified: Secondary | ICD-10-CM

## 2019-03-30 ENCOUNTER — Ambulatory Visit: Payer: Medicare Other | Attending: Internal Medicine

## 2019-03-30 DIAGNOSIS — Z23 Encounter for immunization: Secondary | ICD-10-CM

## 2019-03-30 NOTE — Progress Notes (Signed)
   Covid-19 Vaccination Clinic  Name:  Roger Hays    MRN: [id number] DOB: 10-14-1945  03/30/2019  [name] was observed post Covid-19 immunization for 15 minutes without incidence. He was provided with Vaccine Information Sheet and instruction to access the V-Safe system.   [name] was instructed to call 911 with any severe reactions post vaccine: Marland Kitchen Difficulty breathing  . Swelling of your face and throat  . A fast heartbeat  . A bad rash all over your body  . Dizziness and weakness    Immunizations Administered    Name Date Dose VIS Date Route   Pfizer COVID-19 Vaccine 03/30/2019  5:37 PM 0.3 mL 02/19/2019 Intramuscular   Manufacturer: [employer], [employer]   Lot: [id number]   NDC: [address]-2

## 2019-04-06 ENCOUNTER — Other Ambulatory Visit: Payer: BLUE CROSS/BLUE SHIELD

## 2019-04-06 ENCOUNTER — Ambulatory Visit: Payer: BLUE CROSS/BLUE SHIELD | Admitting: Cardiology

## 2019-04-19 ENCOUNTER — Ambulatory Visit: Payer: Medicare Other | Attending: Internal Medicine

## 2019-04-19 DIAGNOSIS — Z23 Encounter for immunization: Secondary | ICD-10-CM

## 2019-04-19 NOTE — Progress Notes (Signed)
   Covid-19 Vaccination Clinic  Name:  Roger Hays    MRN: [id number] DOB: 10-29-1945  04/19/2019  Roger Hays was observed post Covid-19 immunization for 15 minutes without incidence. He was provided with Vaccine Information Sheet and instruction to access the V-Safe system.   Roger Hays was instructed to call 911 with any severe reactions post vaccine: Marland Kitchen Difficulty breathing  . Swelling of your face and throat  . A fast heartbeat  . A bad rash all over your body  . Dizziness and weakness    Immunizations Administered    Name Date Dose VIS Date Route   Pfizer COVID-19 Vaccine 04/19/2019  8:59 AM 0.3 mL 02/19/2019 Intramuscular   Manufacturer: [employer], [employer]   Lot: [id number]   NDC: [address]-8

## 2019-12-23 ENCOUNTER — Other Ambulatory Visit: Payer: Self-pay | Admitting: Cardiology

## 2019-12-23 DIAGNOSIS — E785 Hyperlipidemia, unspecified: Secondary | ICD-10-CM

## 2021-02-12 ENCOUNTER — Other Ambulatory Visit: Payer: Self-pay

## 2021-02-12 ENCOUNTER — Encounter (HOSPITAL_BASED_OUTPATIENT_CLINIC_OR_DEPARTMENT_OTHER): Payer: Self-pay | Admitting: Orthopaedic Surgery

## 2021-02-13 NOTE — H&P (Signed)
PREOPERATIVE H&P  Chief Complaint: rt shoulder DJD, rotator cuff tear  HPI: Roger Hays is a 75 y.o. male who is scheduled for Procedure(s): REVERSE SHOULDER ARTHROPLASTY.   Patient has a past medical history significant for sleep apnea on CPAP.   Patient is a 75 year-old seen for an injury to his right shoulder that occurred doing some weightlifting, followed by playing golf.  Sharp stabbing pain in his shoulder.  Significant weakness and pain as well.  [place] they gave him a shot in his shoulder which did not help.    His symptoms are rated as moderate to severe, and have been worsening.  This is significantly impairing activities of daily living.    Please see clinic note for further details on this patient's care.    He has elected for surgical management.   Past Medical History:  Diagnosis Date   GI bleeding 12/08/2017   Sleep apnea    CPAP   Past Surgical History:  Procedure Laterality Date   APPENDECTOMY     CARPAL TUNNEL RELEASE     COLONOSCOPY WITH PROPOFOL N/A 12/10/2017   Procedure: COLONOSCOPY WITH PROPOFOL;  Surgeon: Carman Ching, MD;  Location: [facility] ENDOSCOPY;  Service: Endoscopy;  Laterality: N/A;   ELBOW SURGERY     SHOULDER SURGERY     TOTAL KNEE ARTHROPLASTY Bilateral    Social History   Socioeconomic History   Marital status: Married    Spouse name: Not on file   Number of children: Not on file   Years of education: Not on file   Highest education level: Not on file  Occupational History   Not on file  Tobacco Use   Smoking status: Former   Smokeless tobacco: Never  Vaping Use   Vaping Use: Never used  Substance and Sexual Activity   Alcohol use: Yes    Alcohol/week: 0.0 standard drinks    Comment: SOCIAL   Drug use: Never   Sexual activity: Not on file  Other Topics Concern   Not on file  Social History Narrative   Not on file   Social Determinants of  Health   Financial Resource Strain: Not on file  Food Insecurity: Not on file  Transportation Needs: Not on file  Physical Activity: Not on file  Stress: Not on file  Social Connections: Not on file   History reviewed. No pertinent family history. Allergies  Allergen Reactions   Oxycodone Hives   Pseudoephedrine Other (See Comments)    Unknown   Vicodin [Hydrocodone-Acetaminophen] Hives   Oxycontin [Oxycodone Hcl] Hives and Rash   Prior to Admission medications   Medication Sig Start Date End Date Taking? Authorizing Provider  aspirin EC 81 MG tablet Take 81 mg by mouth every other day.   Yes [provider]  folic acid (FOLVITE) 1 MG tablet Take 1 mg by mouth daily. 11/18/17  Yes [provider]  ibuprofen (ADVIL,MOTRIN) 100 MG tablet Take 200 mg by mouth every 6 (six) hours as needed for pain or fever.   Yes [provider]  meloxicam (MOBIC) 15 MG tablet Take 15 mg by mouth daily as needed for pain.  11/18/17  Yes [provider]  pantoprazole (PROTONIX) 40 MG tablet Take 40 mg by mouth daily. 11/27/17  Yes [provider]  polyethylene glycol (MIRALAX) packet Take 17 g by mouth daily. 12/11/17  Yes Criss Alvine,  Angelina Sheriff, MD  Probiotic Product (ALIGN) 4 MG CAPS Take 4 mg by mouth daily.   Yes [provider]  rosuvastatin (CRESTOR) 10 MG tablet Take 10 mg by mouth daily. 11/27/17  Yes [provider]  diclofenac sodium (VOLTAREN) 1 % GEL Apply 1 application topically as needed for pain. elbow 11/13/17   [provider]  methotrexate (RHEUMATREX) 2.5 MG tablet Take 7.5 mg by mouth every Wednesday. evening 11/18/17   [provider]    ROS: All other systems have been reviewed and were otherwise negative with the exception of those mentioned in the HPI and as above.  Physical Exam: General: Alert, no acute distress Cardiovascular: No pedal edema Respiratory: No cyanosis, no use of accessory musculature GI: No  organomegaly, abdomen is soft and non-tender Skin: No lesions in the area of chief complaint Neurologic: Sensation intact distally Psychiatric: Patient is competent for consent with normal mood and affect Lymphatic: No axillary or cervical lymphadenopathy  MUSCULOSKELETAL:  Examination of his right shoulder reveals decreased range of motion by 50% with pain and weakness.  No instability.  No obvious Popeye deformity.  Cervical exam reveals mild right paracervical pain.  Range of motion is intact.  Neurologic exam: Distal motor and sensory examination is within normal limits.  Imaging: MRI of the right shoulder demonstrates right cuff tear which is quite large with retraction of the mid humeral head. There is some tendon still left on the tuberosity as well.  There are no obvious changes to the joint  Assessment: rt shoulder DJD, rotator cuff tear  Plan: Plan for Procedure(s): REVERSE SHOULDER ARTHROPLASTY  He is significantly debilitated by his shoulder and worried about the possibility of re-tear.  He essentially has a type 2 tear with a significant amount of tendon still on bone and a complete rupture medially.  His re-tear risk is approximately 40%.  We discussed the possibility of reverse including less risk of repeat surgery.  He elected to proceed with that.  Specific risks including infection, dislocation and limitations in motion were discussed.  All questions answered.  The risks benefits and alternatives were discussed with the patient including but not limited to the risks of nonoperative treatment, versus surgical intervention including infection, bleeding, nerve injury,  blood clots, cardiopulmonary complications, morbidity, mortality, among others, and they were willing to proceed.   We additionally specifically discussed risks of axillary nerve injury, infection, periprosthetic fracture, continued pain and longevity of implants prior to beginning procedure.    Patient will be  closely monitored in PACU for medical stabilization and pain control. If found stable in PACU, patient may be discharged home with outpatient follow-up. If any concerns regarding patient's stabilization patient will be admitted for observation after surgery. The patient is planning to be discharged home with outpatient PT.   The patient acknowledged the explanation, agreed to proceed with the plan and consent was signed.   Operative Plan: Right reverse total shoulder arthroplasty  Discharge Medications: Standard DVT Prophylaxis: Aspirin Physical Therapy: Outpatient PT  Special Discharge needs: Sling. IceMan   Vernetta Honey, PA-C  02/13/2021 5:44 PM

## 2021-02-15 ENCOUNTER — Encounter (HOSPITAL_BASED_OUTPATIENT_CLINIC_OR_DEPARTMENT_OTHER)
Admission: RE | Admit: 2021-02-15 | Discharge: 2021-02-15 | Disposition: A | Discharge status: Home / Self Care | Payer: Medicare HMO | Source: Ambulatory Visit | Attending: Orthopaedic Surgery | Admitting: Orthopaedic Surgery

## 2021-02-15 DIAGNOSIS — G4733 Obstructive sleep apnea (adult) (pediatric): Secondary | ICD-10-CM | POA: Diagnosis not present

## 2021-02-15 DIAGNOSIS — Z87891 Personal history of nicotine dependence: Secondary | ICD-10-CM | POA: Diagnosis not present

## 2021-02-15 DIAGNOSIS — M75101 Unspecified rotator cuff tear or rupture of right shoulder, not specified as traumatic: Secondary | ICD-10-CM | POA: Diagnosis present

## 2021-02-15 LAB — SURGICAL PCR SCREEN
MRSA, PCR: NEGATIVE
Staphylococcus aureus: NEGATIVE

## 2021-02-15 NOTE — Progress Notes (Signed)
Sent message reminding pt to come in for lab work.  

## 2021-02-15 NOTE — Progress Notes (Signed)
      Enhanced Recovery after Surgery for Orthopedics Enhanced Recovery after Surgery is a protocol used to improve the stress on your body and your recovery after surgery.  Patient Instructions  The night before surgery:  No food after midnight. ONLY clear liquids after midnight  The day of surgery (if you do NOT have diabetes):  Drink ONE (1) Pre-Surgery Clear Ensure as directed.   This drink was given to you during your hospital  pre-op appointment visit. The pre-op nurse will instruct you on the time to drink the  Pre-Surgery Ensure depending on your surgery time. Finish the drink at the designated time by the pre-op nurse.  Nothing else to drink after completing the  Pre-Surgery Clear Ensure.  The day of surgery (if you have diabetes): Drink ONE (1) Gatorade 2 (G2) as directed. This drink was given to you during your hospital  pre-op appointment visit.  The pre-op nurse will instruct you on the time to drink the   Gatorade 2 (G2) depending on your surgery time. Color of the Gatorade may vary. Red is not allowed. Nothing else to drink after completing the  Gatorade 2 (G2).         If you have questions, please contact your surgeon's office. Surgical soap given with instructions, pt verbalized understanding. Benzoyl peroxide gel given with written instructions, pt verbalized understanding.  

## 2021-02-16 ENCOUNTER — Ambulatory Visit (HOSPITAL_BASED_OUTPATIENT_CLINIC_OR_DEPARTMENT_OTHER): Payer: Medicare HMO | Admitting: Anesthesiology

## 2021-02-16 ENCOUNTER — Encounter (HOSPITAL_BASED_OUTPATIENT_CLINIC_OR_DEPARTMENT_OTHER): Payer: Self-pay | Admitting: Orthopaedic Surgery

## 2021-02-16 ENCOUNTER — Ambulatory Visit (HOSPITAL_BASED_OUTPATIENT_CLINIC_OR_DEPARTMENT_OTHER)
Admission: RE | Admit: 2021-02-16 | Discharge: 2021-02-16 | Disposition: A | Discharge status: Home / Self Care | Payer: Medicare HMO | Attending: Orthopaedic Surgery | Admitting: Orthopaedic Surgery

## 2021-02-16 ENCOUNTER — Encounter (HOSPITAL_BASED_OUTPATIENT_CLINIC_OR_DEPARTMENT_OTHER)
Admission: RE | Disposition: A | Discharge status: Home / Self Care | Payer: Self-pay | Source: Home / Self Care | Attending: Orthopaedic Surgery

## 2021-02-16 ENCOUNTER — Ambulatory Visit (HOSPITAL_COMMUNITY): Payer: Medicare HMO

## 2021-02-16 DIAGNOSIS — G4733 Obstructive sleep apnea (adult) (pediatric): Secondary | ICD-10-CM | POA: Insufficient documentation

## 2021-02-16 DIAGNOSIS — K5731 Diverticulosis of large intestine without perforation or abscess with bleeding: Secondary | ICD-10-CM

## 2021-02-16 DIAGNOSIS — Z87891 Personal history of nicotine dependence: Secondary | ICD-10-CM | POA: Diagnosis not present

## 2021-02-16 DIAGNOSIS — M75101 Unspecified rotator cuff tear or rupture of right shoulder, not specified as traumatic: Secondary | ICD-10-CM | POA: Insufficient documentation

## 2021-02-16 DIAGNOSIS — D62 Acute posthemorrhagic anemia: Secondary | ICD-10-CM

## 2021-02-16 DIAGNOSIS — K922 Gastrointestinal hemorrhage, unspecified: Secondary | ICD-10-CM

## 2021-02-16 DIAGNOSIS — K921 Melena: Secondary | ICD-10-CM

## 2021-02-16 DIAGNOSIS — Z09 Encounter for follow-up examination after completed treatment for conditions other than malignant neoplasm: Secondary | ICD-10-CM

## 2021-02-16 HISTORY — PX: REVERSE SHOULDER ARTHROPLASTY: SHX5054

## 2021-02-16 SURGERY — ARTHROPLASTY, SHOULDER, TOTAL, REVERSE
Anesthesia: General | Site: Shoulder | Laterality: Right

## 2021-02-16 MED ORDER — MIDAZOLAM HCL 2 MG/2ML IJ SOLN
INTRAMUSCULAR | Status: AC
  Filled 2021-02-16: qty 2

## 2021-02-16 MED ORDER — SUGAMMADEX SODIUM 200 MG/2ML IV SOLN
INTRAVENOUS | Status: DC | PRN
Start: 2021-02-16 — End: 2021-02-16
  Administered 2021-02-16: 200 mg via INTRAVENOUS

## 2021-02-16 MED ORDER — TRANEXAMIC ACID-NACL 1000-0.7 MG/100ML-% IV SOLN
INTRAVENOUS | Status: AC
  Filled 2021-02-16: qty 100

## 2021-02-16 MED ORDER — ONDANSETRON HCL 4 MG/2ML IJ SOLN
INTRAMUSCULAR | Status: AC
  Filled 2021-02-16: qty 2

## 2021-02-16 MED ORDER — FENTANYL CITRATE (PF) 100 MCG/2ML IJ SOLN
INTRAMUSCULAR | Status: AC
  Filled 2021-02-16: qty 2

## 2021-02-16 MED ORDER — MIDAZOLAM HCL 5 MG/5ML IJ SOLN
INTRAMUSCULAR | Status: DC | PRN
  Administered 2021-02-16 (×2): 1 mg via INTRAVENOUS

## 2021-02-16 MED ORDER — MIDAZOLAM HCL 2 MG/2ML IJ SOLN
1.0000 mg | Freq: Once | INTRAMUSCULAR | Status: AC
  Administered 2021-02-16: 1 mg via INTRAVENOUS

## 2021-02-16 MED ORDER — ONDANSETRON HCL 4 MG PO TABS: 4.0000 mg | ORAL_TABLET | Freq: Three times a day (TID) | ORAL | 0 refills | Status: AC | PRN

## 2021-02-16 MED ORDER — DEXAMETHASONE SODIUM PHOSPHATE 10 MG/ML IJ SOLN
INTRAMUSCULAR | Status: DC | PRN
Start: 2021-02-16 — End: 2021-02-16
  Administered 2021-02-16: 10 mg via INTRAVENOUS

## 2021-02-16 MED ORDER — GABAPENTIN 100 MG PO CAPS: 100.0000 mg | ORAL_CAPSULE | Freq: Three times a day (TID) | ORAL | 0 refills | Status: AC

## 2021-02-16 MED ORDER — ROCURONIUM BROMIDE 10 MG/ML (PF) SYRINGE
PREFILLED_SYRINGE | INTRAVENOUS | Status: AC
  Filled 2021-02-16: qty 10

## 2021-02-16 MED ORDER — AMISULPRIDE (ANTIEMETIC) 5 MG/2ML IV SOLN: 10.0000 mg | Freq: Once | INTRAVENOUS | Status: DC | PRN

## 2021-02-16 MED ORDER — ONDANSETRON HCL 4 MG/2ML IJ SOLN
INTRAMUSCULAR | Status: DC | PRN
  Administered 2021-02-16: 4 mg via INTRAVENOUS

## 2021-02-16 MED ORDER — BUPIVACAINE HCL (PF) 0.5 % IJ SOLN
INTRAMUSCULAR | Status: DC | PRN
  Administered 2021-02-16: 15 mL via PERINEURAL

## 2021-02-16 MED ORDER — DEXAMETHASONE SODIUM PHOSPHATE 10 MG/ML IJ SOLN
INTRAMUSCULAR | Status: AC
  Filled 2021-02-16: qty 1

## 2021-02-16 MED ORDER — BUPIVACAINE LIPOSOME 1.3 % IJ SUSP
INTRAMUSCULAR | Status: DC | PRN
  Administered 2021-02-16: 10 mL

## 2021-02-16 MED ORDER — GABAPENTIN 300 MG PO CAPS
ORAL_CAPSULE | ORAL | Status: AC
  Filled 2021-02-16: qty 1

## 2021-02-16 MED ORDER — VANCOMYCIN HCL 1000 MG IV SOLR
INTRAVENOUS | Status: DC | PRN
  Administered 2021-02-16: 1000 mg via TOPICAL

## 2021-02-16 MED ORDER — CEFAZOLIN SODIUM-DEXTROSE 2-4 GM/100ML-% IV SOLN
INTRAVENOUS | Status: AC
  Filled 2021-02-16: qty 100

## 2021-02-16 MED ORDER — VANCOMYCIN HCL 1000 MG IV SOLR
INTRAVENOUS | Status: AC
  Filled 2021-02-16: qty 20

## 2021-02-16 MED ORDER — TRANEXAMIC ACID-NACL 1000-0.7 MG/100ML-% IV SOLN
1000.0000 mg | INTRAVENOUS | Status: AC
  Administered 2021-02-16: 1000 mg via INTRAVENOUS

## 2021-02-16 MED ORDER — BUPIVACAINE HCL (PF) 0.25 % IJ SOLN
INTRAMUSCULAR | Status: AC
  Filled 2021-02-16: qty 30

## 2021-02-16 MED ORDER — GABAPENTIN 300 MG PO CAPS
300.0000 mg | ORAL_CAPSULE | Freq: Once | ORAL | Status: AC
  Administered 2021-02-16: 300 mg via ORAL

## 2021-02-16 MED ORDER — PROPOFOL 10 MG/ML IV BOLUS
INTRAVENOUS | Status: DC | PRN
  Administered 2021-02-16: 170 mg via INTRAVENOUS
  Administered 2021-02-16: 30 mg via INTRAVENOUS

## 2021-02-16 MED ORDER — FENTANYL CITRATE (PF) 100 MCG/2ML IJ SOLN
INTRAMUSCULAR | Status: DC | PRN
  Administered 2021-02-16: 50 ug via INTRAVENOUS

## 2021-02-16 MED ORDER — LACTATED RINGERS IV SOLN: INTRAVENOUS | Status: DC

## 2021-02-16 MED ORDER — LIDOCAINE HCL (CARDIAC) PF 100 MG/5ML IV SOSY
PREFILLED_SYRINGE | INTRAVENOUS | Status: DC | PRN
  Administered 2021-02-16: 40 mg via INTRAVENOUS

## 2021-02-16 MED ORDER — ACETAMINOPHEN 500 MG PO TABS
1000.0000 mg | ORAL_TABLET | Freq: Once | ORAL | Status: AC
  Administered 2021-02-16: 1000 mg via ORAL

## 2021-02-16 MED ORDER — ACETAMINOPHEN 500 MG PO TABS
ORAL_TABLET | ORAL | Status: AC
  Filled 2021-02-16: qty 2

## 2021-02-16 MED ORDER — ONDANSETRON HCL 4 MG/2ML IJ SOLN: 4.0000 mg | Freq: Once | INTRAMUSCULAR | Status: DC | PRN

## 2021-02-16 MED ORDER — CEFAZOLIN SODIUM-DEXTROSE 2-4 GM/100ML-% IV SOLN
2.0000 g | INTRAVENOUS | Status: AC
  Administered 2021-02-16: 2 g via INTRAVENOUS

## 2021-02-16 MED ORDER — EPHEDRINE SULFATE 50 MG/ML IJ SOLN
INTRAMUSCULAR | Status: DC | PRN
  Administered 2021-02-16: 10 mg via INTRAVENOUS

## 2021-02-16 MED ORDER — HYDROMORPHONE HCL 2 MG PO TABS: 2.0000 mg | ORAL_TABLET | Freq: Four times a day (QID) | ORAL | 0 refills | Status: DC | PRN

## 2021-02-16 MED ORDER — ROCURONIUM BROMIDE 100 MG/10ML IV SOLN
INTRAVENOUS | Status: DC | PRN
  Administered 2021-02-16: 60 mg via INTRAVENOUS
  Administered 2021-02-16: 20 mg via INTRAVENOUS

## 2021-02-16 MED ORDER — FENTANYL CITRATE (PF) 100 MCG/2ML IJ SOLN
50.0000 ug | Freq: Once | INTRAMUSCULAR | Status: AC
  Administered 2021-02-16: 50 ug via INTRAVENOUS

## 2021-02-16 MED ORDER — PROPOFOL 10 MG/ML IV BOLUS
INTRAVENOUS | Status: AC
  Filled 2021-02-16: qty 20

## 2021-02-16 MED ORDER — FENTANYL CITRATE (PF) 100 MCG/2ML IJ SOLN
25.0000 ug | INTRAMUSCULAR | Status: DC | PRN
  Administered 2021-02-16 (×3): 25 ug via INTRAVENOUS

## 2021-02-16 MED ORDER — ACETAMINOPHEN 500 MG PO TABS: 1000.0000 mg | ORAL_TABLET | Freq: Three times a day (TID) | ORAL | 0 refills | Status: AC

## 2021-02-16 MED ORDER — LIDOCAINE 2% (20 MG/ML) 5 ML SYRINGE
INTRAMUSCULAR | Status: AC
  Filled 2021-02-16: qty 5

## 2021-02-16 MED ORDER — 0.9 % SODIUM CHLORIDE (POUR BTL) OPTIME
TOPICAL | Status: DC | PRN
  Administered 2021-02-16: 1000 mL

## 2021-02-16 SURGICAL SUPPLY — 72 items
AID PSTN UNV HD RSTRNT DISP (MISCELLANEOUS) ×1
APL PRP STRL LF DISP 70% ISPRP (MISCELLANEOUS) ×1
BLADE HEX COATED 2.75 (ELECTRODE) IMPLANT
BLADE SAW SAG 73X25 THK (BLADE) ×1
BLADE SAW SGTL 73X25 THK (BLADE) ×1 IMPLANT
BLADE SURG 10 STRL SS (BLADE) IMPLANT
BLADE SURG 15 STRL LF DISP TIS (BLADE) IMPLANT
BLADE SURG 15 STRL SS (BLADE)
BNDG COHESIVE 4X5 TAN ST LF (GAUZE/BANDAGES/DRESSINGS) IMPLANT
BRUSH SCRUB EZ PLAIN DRY (MISCELLANEOUS) ×2 IMPLANT
BSPLAT GLND +3 29 (Joint) ×1 IMPLANT
CHLORAPREP W/TINT 26 (MISCELLANEOUS) ×2 IMPLANT
CLSR STERI-STRIP ANTIMIC 1/2X4 (GAUZE/BANDAGES/DRESSINGS) ×2 IMPLANT
COOLER ICEMAN CLASSIC (MISCELLANEOUS) ×1 IMPLANT
COVER BACK TABLE 60X90IN (DRAPES) ×2 IMPLANT
COVER MAYO STAND STRL (DRAPES) ×2 IMPLANT
DECANTER SPIKE VIAL GLASS SM (MISCELLANEOUS) IMPLANT
DRAPE IMP U-DRAPE 54X76 (DRAPES) ×2 IMPLANT
DRAPE INCISE IOBAN 66X45 STRL (DRAPES) ×2 IMPLANT
DRAPE U-SHAPE 76X120 STRL (DRAPES) ×4 IMPLANT
DRSG AQUACEL AG ADV 3.5X 6 (GAUZE/BANDAGES/DRESSINGS) ×2 IMPLANT
ELECT BLADE 4.0 EZ CLEAN MEGAD (MISCELLANEOUS) ×2
ELECT REM PT RETURN 9FT ADLT (ELECTROSURGICAL) ×2
ELECTRODE BLDE 4.0 EZ CLN MEGD (MISCELLANEOUS) ×1 IMPLANT
ELECTRODE REM PT RTRN 9FT ADLT (ELECTROSURGICAL) ×1 IMPLANT
FACESHIELD WRAPAROUND (MASK) ×4 IMPLANT
FACESHIELD WRAPAROUND OR TEAM (MASK) ×2 IMPLANT
GLENOID BASEPLATE 29 +3 (Joint) ×1 IMPLANT
GLENOSPHERE PERFORM 39 3 (Shoulder) ×2 IMPLANT
GLOVE SRG 8 PF TXTR STRL LF DI (GLOVE) ×1 IMPLANT
GLOVE SURG ENC MOIS LTX SZ6.5 (GLOVE) ×3 IMPLANT
GLOVE SURG LTX SZ8 (GLOVE) ×2 IMPLANT
GLOVE SURG UNDER POLY LF SZ6.5 (GLOVE) ×4 IMPLANT
GLOVE SURG UNDER POLY LF SZ8 (GLOVE) ×2
GOWN STRL REUS W/ TWL LRG LVL3 (GOWN DISPOSABLE) ×2 IMPLANT
GOWN STRL REUS W/TWL LRG LVL3 (GOWN DISPOSABLE) ×4
GOWN STRL REUS W/TWL XL LVL3 (GOWN DISPOSABLE) ×2 IMPLANT
GUIDE PIN 3X75 SHOULDER (PIN) ×2
GUIDEWIRE GLENOID 2.5X220 (WIRE) ×1 IMPLANT
HANDPIECE INTERPULSE COAX TIP (DISPOSABLE) ×2
INSERT HUM THK+3X.75 39XSTRL (Joint) IMPLANT
INSERT REVERSED THICKNESS +3 (Joint) ×2 IMPLANT
INSRT HUM THK+3X.75 39XSTRL (Joint) ×1 IMPLANT
KIT SHOULDER STAB MARCO (KITS) ×2 IMPLANT
MANIFOLD NEPTUNE II (INSTRUMENTS) ×2 IMPLANT
PACK BASIN DAY SURGERY FS (CUSTOM PROCEDURE TRAY) ×2 IMPLANT
PACK SHOULDER (CUSTOM PROCEDURE TRAY) ×2 IMPLANT
PAD COLD SHLDR WRAP-ON (PAD) ×1 IMPLANT
PAD ORTHO SHOULDER 7X19 LRG (SOFTGOODS) ×2 IMPLANT
PENCIL SMOKE EVACUATOR (MISCELLANEOUS) IMPLANT
PIN GUIDE 3X75 SHOULDER (PIN) IMPLANT
RESTRAINT HEAD UNIVERSAL NS (MISCELLANEOUS) ×2 IMPLANT
SCREW CENTRAL THREAD 6.5X45 (Screw) ×1 IMPLANT
SCREW PERIPHERAL 5.0X34 (Screw) ×2 IMPLANT
SET HNDPC FAN SPRY TIP SCT (DISPOSABLE) ×1 IMPLANT
SHEET MEDIUM DRAPE 40X70 STRL (DRAPES) ×2 IMPLANT
SLEEVE SCD COMPRESS KNEE MED (STOCKING) ×2 IMPLANT
SPHERE GLENOD +3X39XLATERALIZE (Shoulder) IMPLANT
SPHR GLND +3X39XLATERALIZE (Shoulder) ×1 IMPLANT
SPONGE T-LAP 18X18 ~~LOC~~+RFID (SPONGE) IMPLANT
STEM HUMERAL STD SHORT SZ3 (Joint) ×1 IMPLANT
SUT ETHIBOND 2 V 37 (SUTURE) ×2 IMPLANT
SUT ETHIBOND NAB CT1 #1 30IN (SUTURE) ×2 IMPLANT
SUT FIBERWIRE #5 38 CONV NDL (SUTURE) ×8
SUT MNCRL AB 4-0 PS2 18 (SUTURE) IMPLANT
SUT VIC AB 0 CT1 27 (SUTURE)
SUT VIC AB 0 CT1 27XCR 8 STRN (SUTURE) IMPLANT
SUT VIC AB 3-0 SH 27 (SUTURE) ×2
SUT VIC AB 3-0 SH 27X BRD (SUTURE) ×1 IMPLANT
SUTURE FIBERWR #5 38 CONV NDL (SUTURE) ×4 IMPLANT
TOWEL GREEN STERILE FF (TOWEL DISPOSABLE) ×6 IMPLANT
TUBE SUCTION HIGH CAP CLEAR NV (SUCTIONS) ×2 IMPLANT

## 2021-02-16 NOTE — Anesthesia Postprocedure Evaluation (Signed)
Anesthesia Post Note  Patient: Roger Hays  Procedure(s) Performed: REVERSE SHOULDER ARTHROPLASTY (Right: Shoulder)     Patient location during evaluation: PACU Anesthesia Type: General Level of consciousness: awake and alert Pain management: pain level controlled Vital Signs Assessment: post-procedure vital signs reviewed and stable Respiratory status: spontaneous breathing, nonlabored ventilation and respiratory function stable Cardiovascular status: blood pressure returned to baseline and stable Postop Assessment: no apparent nausea or vomiting Anesthetic complications: no   No notable events documented.  Last Vitals:  Vitals:   02/16/21 1331 02/16/21 1357  BP: (!) 146/93 (!) 162/95  Pulse: 75 81  Resp: 11 18  Temp:  36.6 C  SpO2: 93% 95%    Last Pain:  Vitals:   02/16/21 1357  TempSrc: Oral  PainSc: 3                  Lucretia Kern

## 2021-02-16 NOTE — Anesthesia Procedure Notes (Signed)
Procedure Name: Intubation Date/Time: 02/16/2021 11:19 AM Performed by: Garrel Ridgel, CRNA Pre-anesthesia Checklist: Patient identified, Emergency Drugs available, Suction available and Patient being monitored Patient Re-evaluated:Patient Re-evaluated prior to induction Oxygen Delivery Method: Circle system utilized Preoxygenation: Pre-oxygenation with 100% oxygen Induction Type: IV induction Ventilation: Mask ventilation without difficulty Laryngoscope Size: Mac and 4 Grade View: Grade II Tube type: Oral Tube size: 7.5 mm Number of attempts: 2 Airway Equipment and Method: Stylet and Oral airway Placement Confirmation: ETT inserted through vocal cords under direct vision, positive ETCO2 and breath sounds checked- equal and bilateral Secured at: 23 cm Tube secured with: Tape Dental Injury: Teeth and Oropharynx as per pre-operative assessment

## 2021-02-16 NOTE — Anesthesia Preprocedure Evaluation (Signed)
Anesthesia Evaluation  Patient identified by MRN, date of birth, ID band Patient awake    Reviewed: Allergy & Precautions, NPO status , Patient's Chart, lab work & pertinent test results  History of Anesthesia Complications Negative for: history of anesthetic complications  Airway Mallampati: I  TM Distance: >3 FB Neck ROM: Full    Dental  (+) Dental Advisory Given, Teeth Intact   Pulmonary sleep apnea and Continuous Positive Airway Pressure Ventilation , former smoker,    Pulmonary exam normal        Cardiovascular negative cardio ROS Normal cardiovascular exam     Neuro/Psych negative neurological ROS     GI/Hepatic Neg liver ROS, H/o GI bleed with severe anemia- Hgb now WNL   Endo/Other  negative endocrine ROS  Renal/GU negative Renal ROS  negative genitourinary   Musculoskeletal  (+) Arthritis ,   Abdominal   Peds  Hematology negative hematology ROS (+)   Anesthesia Other Findings   Reproductive/Obstetrics                            Anesthesia Physical Anesthesia Plan  ASA: 2  Anesthesia Plan: General   Post-op Pain Management: Regional block, Tylenol PO (pre-op) and Toradol IV (intra-op)   Induction: Intravenous  PONV Risk Score and Plan: 2 and Ondansetron, Dexamethasone, Treatment may vary due to age or medical condition and Midazolam  Airway Management Planned: Oral ETT  Additional Equipment: None  Intra-op Plan:   Post-operative Plan: Extubation in OR  Informed Consent: I have reviewed the patients History and Physical, chart, labs and discussed the procedure including the risks, benefits and alternatives for the proposed anesthesia with the patient or authorized representative who has indicated his/her understanding and acceptance.     Dental advisory given  Plan Discussed with:   Anesthesia Plan Comments:         Anesthesia Quick Evaluation

## 2021-02-16 NOTE — Interval H&P Note (Signed)
All questions answered

## 2021-02-16 NOTE — Op Note (Signed)
Orthopaedic Surgery Operative Note (CSN: [id number])  Roger Hays  Oct 05, 1945 Date of Surgery: 02/16/2021   Diagnoses:  Right shoulder irreparable rotator cuff tear  Procedure: Right reverse total Shoulder Arthroplasty   Operative Finding Successful completion of planned procedure.  Patient's cuff tear was atypical with about 2 cm of tendon left on the tuberosity.  We felt based on this he was at high risk for failure and we felt that a reverse total shoulder arthroplasty was in his best interest.  He had good fixation both on the glenoid as well as the humerus.  Stability of the joint was robust.  Tug test was normal  Post-operative plan: The patient will be NWB in sling.  The patient will be will be discharged from PACU if continues to be stable as was plan prior to surgery.  DVT prophylaxis will be avoided as the patient has a history of GI bleed.  Pain control with PRN pain medication preferring oral medicines.  Follow up plan will be scheduled in approximately 7 days for incision check and XR.  Physical therapy to start this week.  Implants: Tornier size 3 perform humeral implant, +3 poly-, 39+3 glenosphere and 29+3 baseplate with a 45 center screw  Post-Op Diagnosis: Same Surgeons:Primary: Bjorn Pippin, MD Assistants:Caroline McBane PA-C Location: [facility] OR ROOM 5 Anesthesia: General with Exparel Interscalene Antibiotics: Ancef 2g preop, Vancomycin 1000mg  locally Tourniquet time: None Estimated Blood Loss: 100 Complications: None Specimens: None Implants: Implant Name Type Inv. Item Serial No. Manufacturer Lot No. LRB No. Used Action  GLENOID BASEPLATE 29 +3 - Joint GLENOID BASEPLATE 29 +3 [id number] [employer]  Right 1 Implanted  aequalis perform reversed lateral glenosphere Orthopedic Implant  [id number] [employer]  Right 1 Implanted  Tornier Reversed Insert   [id number]  [id number] Right 1 Implanted  Tornier Humeral Stem STD Short   [id number]  [id number] Right 1  Implanted  SCREW CENTRAL THREAD 6.5X45 - [id number] Screw SCREW CENTRAL THREAD 6.5X45  [employer] ON SET Right 1 Implanted  SCREW PERIPHERAL 5.0X34 - [id number] Screw SCREW PERIPHERAL 5.0X34  [employer] ON SET Right 2 Implanted    Indications for Surgery:   Roger Hays is a 75 y.o. 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  Benefits and risks of operative and nonoperative management were discussed prior to surgery with patient/guardian(s) and informed consent form was completed.  Infection and need for further surgery were discussed as was prosthetic stability and cuff issues.  We additionally specifically discussed risks of axillary nerve injury, infection, periprosthetic fracture, continued pain and longevity of implants prior to beginning procedure.      Procedure:   The patient was identified in the preoperative holding area where the surgical site was marked. Block placed by anesthesia with exparel.  The patient was taken to the OR where a procedural timeout was called and the above noted anesthesia was induced.  The patient was positioned beachchair on allen table with spider arm positioner.  Preoperative antibiotics were dosed.  The patient's right shoulder was prepped and draped in the usual sterile fashion.  A second preoperative timeout was called.       Standard deltopectoral approach was performed with a #10 blade. We dissected down to the subcutaneous tissues and the cephalic vein was taken laterally with the deltoid. Clavipectoral fascia was incised in line with the incision. Deep retractors were placed. The long of the biceps tendon was identified and there was significant tenosynovitis present.  Tenodesis  was performed to the pectoralis tendon with #2 Ethibond. The remaining biceps was followed up into the rotator interval where it was released.   The subscapularis was taken down in a full thickness layer with capsule along the humeral neck extending inferiorly  around the humeral head. We continued releasing the capsule directly off of the osteophytes inferiorly all the way around the corner. This allowed [place] to dislocate the humeral head.   The rotator cuff was carefully examined and noted to be irreperably torn.  The decision was confirmed that a reverse total shoulder was indicated for this patient.  There were osteophytes along the inferior humeral neck. The osteophytes were removed with an osteotome and a rongeur.  Osteophytes were removed with a rongeur and an osteotome and the anatomic neck was well visualized.     A humeral cutting guide was used extra medullary with a pin to help control version. The version was set at 20 of retroversion. Humeral osteotomy was performed with an oscillating saw. The head fragment was passed off the back table.  A cut protector plate was placed.  The subscapularis was again identified and immediately we took care to palpate the axillary nerve anteriorly and verify its position with gentle palpation as well as the tug test.  We then released the SGHL with bovie cautery prior to placing a curved mayo at the junction of the anterior glenoid well above the axillary nerve and bluntly dissecting the subscapularis from the capsule.  We then carefully protected the axillary nerve as we gently released the inferior capsule to fully mobilize the subscapularis.  An anterior deltoid retractor was then placed as well as a small Hohmann retractor superiorly.    The glenoid was relatively intact in the setting of an irreparable cuff tear  The remaining labrum was removed circumferentially taking great care not to disrupt the posterior capsule.   The glenoid drill guide was placed and used to drill a guide pin in the center, inferior position. The glenoid face was then reamed concentrically over the guide wire. The center hole was drilled over the guidepin in a near anatomic angle of version. Next the glenoid vault was drilled back  to a depth of 45 mm.  We tapped and then placed a 29 millimeters size baseplate with additional 74mm lateralization was selected with a 6.5 mm x 45 mm length central screw.  The base plate was screwed into the glenoid vault obtaining secure fixation. We next placed superior and inferior locking screws for additional fixation.  Next a 39+3 mm glenosphere was selected and impacted onto the baseplate. The center screw was tightened.  We turned attention back to the humeral side. The cut protector was removed.  We used the perform humeral sizing block to select the appropriate size which for this patient was a 3.  We then placed our center pin and reamed over it concentrically obtaining appropriate inset.  We then used our lateralizing chisel to prepare the lateral aspect of the humerus.  At that point we selected the appropriate implant trialing a standard.  Using this trial implant we trialed multiple polyethylene sizes settling on a +3 which provided good stability and range of motion without excess soft tissue tension. The offset was dialed in to match the normal anatomy. The shoulder was trialed.  There was good ROM in all planes and the shoulder was stable with no inferior translation.  The real humeral implants were opened after again confirming sizes.  The trial was  removed. #5 Fiberwire x4 sutures passed through the humeral neck for subscap repair. The humeral component was press-fit obtaining a secure fit. The joint was reduced and thoroughly irrigated with pulsatile lavage. Subscap was repaired back with #5 Fiberwire sutures through bone tunnels. Hemostasis was obtained. The deltopectoral interval was reapproximated with #1 Ethibond. The subcutaneous tissues were closed with 2-0 Vicryl and the skin was closed with running monocryl.    The wounds were cleaned and dried and an Aquacel dressing was placed. The drapes taken down. The arm was placed into sling with abduction pillow. Patient was awakened,  extubated, and transferred to the recovery room in stable condition. There were no intraoperative complications. The sponge, needle, and attention counts were  correct at the end of the case.     Alfonse Alpers, PA-C, present and scrubbed throughout the case, critical for completion in a timely fashion, and for retraction, instrumentation, closure.

## 2021-02-16 NOTE — Discharge Instructions (Addendum)
Ramond Marrow MD, MPH Alfonse Alpers, PA-C [facility] [address], [address] [phone] (tel)   [phone] (fax)   POST-OPERATIVE INSTRUCTIONS - TOTAL SHOULDER REPLACEMENT    WOUND CARE You may leave the operative dressing in place until your follow-up appointment. KEEP THE INCISIONS CLEAN AND DRY. There may be a small amount of fluid/bleeding leaking at the surgical site. This is normal after surgery.  If it fills with liquid or blood please call us immediately to change it for you. Use the provided ice machine or Ice packs as often as possible for the first 3-4 days, then as needed for pain relief.   Keep a layer of cloth or a shirt between your skin and the cooling unit to prevent frost bite as it can get very cold.  SHOWERING: - You may shower on Post-Op Day #2.  - The dressing is water resistant but do not scrub it as it may start to peel up.   - You may remove the sling for showering, but keep a water resistant pillow under the arm to keep both the  elbow and shoulder away from the body (mimicking the abduction sling).  - Gently pat the area dry.  - Do not soak the shoulder in water. Do not go swimming in the pool or ocean until your incision has completely healed (about 4-6 weeks after surgery) - KEEP THE INCISIONS CLEAN AND DRY.  EXERCISES Wear the sling at all times You may remove the sling for showering, but keep the arm across the chest or in a secondary sling.    Accidental/Purposeful External Rotation and shoulder flexion (reaching behind you) is to be avoided at all costs for the first month. It is ok to come out of your sling if your are sitting and have assistance for eating.   Do not lift anything heavier than 1 pound until we discuss it further in clinic.   REGIONAL ANESTHESIA (NERVE BLOCKS) The anesthesia team may have performed a nerve block for you if safe in the setting of your care.  This is a great tool used to minimize pain.   Typically the block may start wearing off overnight but the long acting medicine may last for 3-4 days.  The nerve block wearing off can be a challenging period but please utilize your as needed pain medications to try and manage this period.    POST-OP MEDICATIONS- Multimodal approach to pain control In general your pain will be controlled with a combination of substances.  Prescriptions unless otherwise discussed are electronically sent to your pharmacy.  This is a carefully made plan we use to minimize narcotic use.     Acetaminophen - Non-narcotic pain medicine taken on a scheduled basis  Gabapentin - this is a medication to help with nerve based pain, take on a scheduled basis Dilaudid - This is a strong narcotic, to be used only on an "as needed" basis for SEVERE pain. Zofran -  take as needed for nausea   FOLLOW-UP If you develop a Fever (>101.5), Redness or Drainage from the surgical incision site, please call our office to arrange for an evaluation. Please call the office to schedule a follow-up appointment for a wound check, 7-10 days post-operatively.  IF YOU HAVE ANY QUESTIONS, PLEASE FEEL FREE TO CALL OUR OFFICE.  HELPFUL INFORMATION  If you had a block, it will wear off between 8-24 hrs postop typically.  This is period when your pain may go from nearly zero  to the pain you would have had post-op without the block.  This is an abrupt transition but nothing dangerous is happening.  You may take an extra dose of narcotic when this happens.  Your arm will be in a sling following surgery. You will be in this sling for the next 4 weeks.  I will let you know the exact duration at your follow-up visit.  You may be more comfortable sleeping in a semi-seated position the first few nights following surgery.  Keep a pillow propped under the elbow and forearm for comfort.  If you have a recliner type of chair it might be beneficial.  If not that is fine too, but it would be helpful to sleep  propped up with pillows behind your operated shoulder as well under your elbow and forearm.  This will reduce pulling on the suture lines.  When dressing, put your operative arm in the sleeve first.  When getting undressed, take your operative arm out last.  Loose fitting, button-down shirts are recommended.  In most states it is against the law to drive while your arm is in a sling. And certainly against the law to drive while taking narcotics.  You may return to work/school in the next couple of days when you feel up to it. Desk work and typing in the sling is fine.  We suggest you use the pain medication the first night prior to going to bed, in order to ease any pain when the anesthesia wears off. You should avoid taking pain medications on an empty stomach as it will make you nauseous.  Do not drink alcoholic beverages or take illicit drugs when taking pain medications.  Pain medication may make you constipated.  Below are a few solutions to try in this order: Decrease the amount of pain medication if you aren't having pain. Drink lots of decaffeinated fluids. Drink prune juice and/or each dried prunes  If the first 3 don't work start with additional solutions Take Colace - an over-the-counter stool softener Take Senokot - an over-the-counter laxative Take Miralax - a stronger over-the-counter laxative   Dental Antibiotics:  In most cases prophylactic antibiotics for Dental procdeures after total joint surgery are not necessary.  Exceptions are as follows:  1. History of prior total joint infection  2. Severely immunocompromised (Organ Transplant, cancer chemotherapy, Rheumatoid biologic meds such as Humera)  3. Poorly controlled diabetes (A1C &gt; 8.0, blood glucose over 200)  If you have one of these conditions, contact your surgeon for an antibiotic prescription, prior to your dental procedure.   For more information including helpful videos and documents visit our  website:   [email or web address]   Next dose of Tylenol after 5pm as needed for pain.  Information for Discharge Teaching: EXPAREL (bupivacaine liposome injectable suspension)   Your surgeon or anesthesiologist gave you EXPAREL(bupivacaine) to help control your pain after surgery.  EXPAREL is a local anesthetic that provides pain relief by numbing the tissue around the surgical site. EXPAREL is designed to release pain medication over time and can control pain for up to 72 hours. Depending on how you respond to EXPAREL, you may require less pain medication during your recovery.  Possible side effects: Temporary loss of sensation or ability to move in the area where bupivacaine was injected. Nausea, vomiting, constipation Rarely, numbness and tingling in your mouth or lips, lightheadedness, or anxiety may occur. Call your doctor right away if you think you may be experiencing any of these sensations, or  if you have other questions regarding possible side effects.  Follow all other discharge instructions given to you by your surgeon or nurse. Eat a healthy diet and drink plenty of water or other fluids.  If you return to the hospital for any reason within 96 hours following the administration of EXPAREL, it is important for health care providers to know that you have received this anesthetic. A teal colored band has been placed on your arm with the date, time and amount of EXPAREL you have received in order to alert and inform your health care providers. Please leave this armband in place for the full 96 hours following administration, and then you may remove the band.

## 2021-02-16 NOTE — Transfer of Care (Signed)
Immediate Anesthesia Transfer of Care Note  Patient: Roger Hays  Procedure(s) Performed: REVERSE SHOULDER ARTHROPLASTY (Right: Shoulder)  Patient Location: PACU  Anesthesia Type:General  Level of Consciousness: awake, alert , oriented and patient cooperative  Airway & Oxygen Therapy: Patient Spontanous Breathing and Patient connected to face mask oxygen  Post-op Assessment: Report given to RN and Post -op Vital signs reviewed and stable  Post vital signs: Reviewed and stable  Last Vitals:  Vitals Value Taken Time  BP 181/108 02/16/21 1245  Temp    Pulse 85 02/16/21 1247  Resp 16 02/16/21 1247  SpO2 98 % 02/16/21 1247  Vitals shown include unvalidated device data.  Last Pain:  Vitals:   02/16/21 0900  TempSrc: Oral  PainSc: 0-No pain         Complications: No notable events documented.

## 2021-02-16 NOTE — Anesthesia Procedure Notes (Signed)
Anesthesia Regional Block: Interscalene brachial plexus block   Pre-Anesthetic Checklist: , timeout performed,  Correct Patient, Correct Site, Correct Laterality,  Correct Procedure, Correct Position, site marked,  Risks and benefits discussed,  Surgical consent,  Pre-op evaluation,  At surgeon's request and post-op pain management  Laterality: Right  Prep: chloraprep       Needles:  Injection technique: Single-shot  Needle Type: Echogenic Stimulator Needle     Needle Length: 10cm  Needle Gauge: 20     Additional Needles:   Procedures:,,,, ultrasound used (permanent image in chart),,    Narrative:  Start time: 02/16/2021 10:05 AM End time: 02/16/2021 10:11 AM Injection made incrementally with aspirations every 5 mL.  Performed by: Personally  Anesthesiologist: Lucretia Kern, MD  Additional Notes: Standard monitors applied. Skin prepped. Good needle visualization with ultrasound. Injection made in 5cc increments with no resistance to injection. Patient tolerated the procedure well.

## 2021-02-19 NOTE — Addendum Note (Signed)
Addendum  created 02/19/21 1603 by Jamirah Zelaya, Jewel Baize, CRNA   Charge Capture section accepted

## 2021-02-21 ENCOUNTER — Encounter (HOSPITAL_BASED_OUTPATIENT_CLINIC_OR_DEPARTMENT_OTHER): Payer: Self-pay | Admitting: Orthopaedic Surgery

## 2021-10-23 ENCOUNTER — Ambulatory Visit
Admission: RE | Admit: 2021-10-23 | Discharge: 2021-10-23 | Disposition: A | Discharge status: Home / Self Care | Payer: Medicare HMO | Source: Ambulatory Visit | Attending: Orthopaedic Surgery | Admitting: Orthopaedic Surgery

## 2021-10-23 ENCOUNTER — Other Ambulatory Visit: Payer: Self-pay | Admitting: Orthopaedic Surgery

## 2021-10-23 DIAGNOSIS — S42476A Nondisplaced transcondylar fracture of unspecified humerus, initial encounter for closed fracture: Secondary | ICD-10-CM

## 2021-11-08 ENCOUNTER — Other Ambulatory Visit (HOSPITAL_COMMUNITY): Payer: Self-pay | Admitting: Orthopaedic Surgery

## 2021-11-08 ENCOUNTER — Ambulatory Visit (HOSPITAL_COMMUNITY)
Admission: RE | Admit: 2021-11-08 | Discharge: 2021-11-08 | Disposition: A | Discharge status: Home / Self Care | Payer: Medicare HMO | Source: Ambulatory Visit | Attending: Orthopaedic Surgery | Admitting: Orthopaedic Surgery

## 2021-11-08 DIAGNOSIS — S46219A Strain of muscle, fascia and tendon of other parts of biceps, unspecified arm, initial encounter: Secondary | ICD-10-CM | POA: Insufficient documentation

## 2021-11-09 ENCOUNTER — Other Ambulatory Visit: Payer: Self-pay

## 2021-11-09 ENCOUNTER — Encounter (HOSPITAL_BASED_OUTPATIENT_CLINIC_OR_DEPARTMENT_OTHER): Payer: Self-pay | Admitting: Orthopaedic Surgery

## 2021-11-15 ENCOUNTER — Encounter (HOSPITAL_BASED_OUTPATIENT_CLINIC_OR_DEPARTMENT_OTHER)
Admission: RE | Admit: 2021-11-15 | Discharge: 2021-11-15 | Disposition: A | Discharge status: Home / Self Care | Payer: Medicare HMO | Source: Ambulatory Visit | Attending: Orthopaedic Surgery | Admitting: Orthopaedic Surgery

## 2021-11-15 ENCOUNTER — Other Ambulatory Visit: Payer: Self-pay

## 2021-11-15 DIAGNOSIS — I10 Essential (primary) hypertension: Secondary | ICD-10-CM | POA: Insufficient documentation

## 2021-11-15 DIAGNOSIS — Z01810 Encounter for preprocedural cardiovascular examination: Secondary | ICD-10-CM | POA: Insufficient documentation

## 2021-11-15 NOTE — Progress Notes (Signed)
Gave patient CHG soap with instructions, patient verbalized understanding.       Patient Instructions  The night before surgery:  No food after midnight. ONLY clear liquids after midnight  The day of surgery (if you do NOT have diabetes):  Drink ONE (1) Pre-Surgery Clear Ensure as directed.   This drink was given to you during your hospital  pre-op appointment visit. The pre-op nurse will instruct you on the time to drink the  Pre-Surgery Ensure depending on your surgery time. Finish the drink at the designated time by the pre-op nurse.  Nothing else to drink after completing the  Pre-Surgery Clear Ensure.  The day of surgery (if you have diabetes): Drink ONE (1) Gatorade 2 (G2) as directed. This drink was given to you during your hospital  pre-op appointment visit.  The pre-op nurse will instruct you on the time to drink the   Gatorade 2 (G2) depending on your surgery time. Color of the Gatorade may vary. Red is not allowed. Nothing else to drink after completing the  Gatorade 2 (G2).         If you have questions, please contact your surgeon's office. 

## 2021-11-16 ENCOUNTER — Encounter (HOSPITAL_BASED_OUTPATIENT_CLINIC_OR_DEPARTMENT_OTHER)
Admission: RE | Disposition: A | Discharge status: Home / Self Care | Payer: Self-pay | Source: Home / Self Care | Attending: Orthopaedic Surgery

## 2021-11-16 ENCOUNTER — Ambulatory Visit (HOSPITAL_BASED_OUTPATIENT_CLINIC_OR_DEPARTMENT_OTHER): Payer: Medicare HMO | Admitting: Certified Registered"

## 2021-11-16 ENCOUNTER — Ambulatory Visit (HOSPITAL_COMMUNITY): Payer: Medicare HMO

## 2021-11-16 ENCOUNTER — Other Ambulatory Visit: Payer: Self-pay

## 2021-11-16 ENCOUNTER — Ambulatory Visit (HOSPITAL_BASED_OUTPATIENT_CLINIC_OR_DEPARTMENT_OTHER)
Admission: RE | Admit: 2021-11-16 | Discharge: 2021-11-16 | Disposition: A | Discharge status: Home / Self Care | Payer: Medicare HMO | Attending: Orthopaedic Surgery | Admitting: Orthopaedic Surgery

## 2021-11-16 ENCOUNTER — Encounter (HOSPITAL_BASED_OUTPATIENT_CLINIC_OR_DEPARTMENT_OTHER): Payer: Self-pay | Admitting: Orthopaedic Surgery

## 2021-11-16 DIAGNOSIS — M84311A Stress fracture, right shoulder, initial encounter for fracture: Secondary | ICD-10-CM | POA: Insufficient documentation

## 2021-11-16 DIAGNOSIS — M1990 Unspecified osteoarthritis, unspecified site: Secondary | ICD-10-CM | POA: Diagnosis not present

## 2021-11-16 DIAGNOSIS — S42121K Displaced fracture of acromial process, right shoulder, subsequent encounter for fracture with nonunion: Secondary | ICD-10-CM | POA: Diagnosis not present

## 2021-11-16 DIAGNOSIS — Z87891 Personal history of nicotine dependence: Secondary | ICD-10-CM | POA: Diagnosis not present

## 2021-11-16 DIAGNOSIS — M84411A Pathological fracture, right shoulder, initial encounter for fracture: Secondary | ICD-10-CM | POA: Diagnosis present

## 2021-11-16 DIAGNOSIS — I10 Essential (primary) hypertension: Secondary | ICD-10-CM | POA: Insufficient documentation

## 2021-11-16 DIAGNOSIS — G4730 Sleep apnea, unspecified: Secondary | ICD-10-CM | POA: Insufficient documentation

## 2021-11-16 DIAGNOSIS — K219 Gastro-esophageal reflux disease without esophagitis: Secondary | ICD-10-CM | POA: Diagnosis not present

## 2021-11-16 HISTORY — DX: Displaced fracture of acromial process, unspecified shoulder, initial encounter for closed fracture: S42.123A

## 2021-11-16 HISTORY — DX: Hyperlipidemia, unspecified: E78.5

## 2021-11-16 HISTORY — PX: HARVEST BONE GRAFT: SHX377

## 2021-11-16 HISTORY — DX: Unspecified osteoarthritis, unspecified site: M19.90

## 2021-11-16 HISTORY — PX: ORIF SHOULDER FRACTURE: SHX5035

## 2021-11-16 HISTORY — DX: Essential (primary) hypertension: I10

## 2021-11-16 HISTORY — DX: Gastro-esophageal reflux disease without esophagitis: K21.9

## 2021-11-16 SURGERY — OPEN REDUCTION INTERNAL FIXATION (ORIF) SHOULDER FRACTURE
Anesthesia: General | Site: Shoulder | Laterality: Right

## 2021-11-16 MED ORDER — PROPOFOL 10 MG/ML IV BOLUS
INTRAVENOUS | Status: DC | PRN
  Administered 2021-11-16: 200 mg via INTRAVENOUS

## 2021-11-16 MED ORDER — BUPIVACAINE LIPOSOME 1.3 % IJ SUSP
INTRAMUSCULAR | Status: DC | PRN
  Administered 2021-11-16: 10 mL via PERINEURAL

## 2021-11-16 MED ORDER — DEXAMETHASONE SODIUM PHOSPHATE 4 MG/ML IJ SOLN
INTRAMUSCULAR | Status: DC | PRN
  Administered 2021-11-16: 5 mg via INTRAVENOUS

## 2021-11-16 MED ORDER — ROCURONIUM BROMIDE 100 MG/10ML IV SOLN
INTRAVENOUS | Status: DC | PRN
  Administered 2021-11-16: 50 mg via INTRAVENOUS
  Administered 2021-11-16: 30 mg via INTRAVENOUS

## 2021-11-16 MED ORDER — ACETAMINOPHEN 500 MG PO TABS: 1000.0000 mg | ORAL_TABLET | Freq: Three times a day (TID) | ORAL | 0 refills | Status: AC

## 2021-11-16 MED ORDER — HYDROMORPHONE HCL 1 MG/ML IJ SOLN
0.5000 mg | INTRAMUSCULAR | Status: AC
  Administered 2021-11-16: 0.5 mg via INTRAVENOUS

## 2021-11-16 MED ORDER — OXYCODONE HCL 5 MG PO TABS: ORAL_TABLET | ORAL | 0 refills | Status: AC

## 2021-11-16 MED ORDER — LACTATED RINGERS IV SOLN: INTRAVENOUS | Status: DC

## 2021-11-16 MED ORDER — BUPIVACAINE HCL (PF) 0.5 % IJ SOLN
INTRAMUSCULAR | Status: DC | PRN
  Administered 2021-11-16: 15 mL via PERINEURAL

## 2021-11-16 MED ORDER — MIDAZOLAM HCL 2 MG/2ML IJ SOLN
1.0000 mg | Freq: Once | INTRAMUSCULAR | Status: AC
  Administered 2021-11-16: 1 mg via INTRAVENOUS

## 2021-11-16 MED ORDER — ASPIRIN 81 MG PO CHEW: 81.0000 mg | CHEWABLE_TABLET | Freq: Two times a day (BID) | ORAL | 0 refills | Status: AC

## 2021-11-16 MED ORDER — ACETAMINOPHEN 500 MG PO TABS
1000.0000 mg | ORAL_TABLET | Freq: Once | ORAL | Status: AC
  Administered 2021-11-16: 1000 mg via ORAL

## 2021-11-16 MED ORDER — PHENYLEPHRINE HCL (PRESSORS) 10 MG/ML IV SOLN
INTRAVENOUS | Status: AC
  Filled 2021-11-16: qty 1

## 2021-11-16 MED ORDER — PHENYLEPHRINE HCL-NACL 20-0.9 MG/250ML-% IV SOLN
INTRAVENOUS | Status: DC | PRN
  Administered 2021-11-16: 25 ug/min via INTRAVENOUS

## 2021-11-16 MED ORDER — HYDROMORPHONE HCL 1 MG/ML IJ SOLN
INTRAMUSCULAR | Status: AC
  Filled 2021-11-16: qty 0.5

## 2021-11-16 MED ORDER — VANCOMYCIN HCL 1000 MG IV SOLR
INTRAVENOUS | Status: DC | PRN
  Administered 2021-11-16: 1000 mg

## 2021-11-16 MED ORDER — MIDAZOLAM HCL 2 MG/2ML IJ SOLN
INTRAMUSCULAR | Status: AC
  Filled 2021-11-16: qty 2

## 2021-11-16 MED ORDER — METHOCARBAMOL 500 MG PO TABS: 500.0000 mg | ORAL_TABLET | Freq: Three times a day (TID) | ORAL | 0 refills | Status: AC | PRN

## 2021-11-16 MED ORDER — PROPOFOL 10 MG/ML IV BOLUS
INTRAVENOUS | Status: AC
  Filled 2021-11-16: qty 20

## 2021-11-16 MED ORDER — SUGAMMADEX SODIUM 500 MG/5ML IV SOLN
INTRAVENOUS | Status: DC | PRN
  Administered 2021-11-16: 400 mg via INTRAVENOUS

## 2021-11-16 MED ORDER — FENTANYL CITRATE (PF) 100 MCG/2ML IJ SOLN
INTRAMUSCULAR | Status: DC | PRN
  Administered 2021-11-16: 100 ug via INTRAVENOUS

## 2021-11-16 MED ORDER — FENTANYL CITRATE (PF) 100 MCG/2ML IJ SOLN
50.0000 ug | Freq: Once | INTRAMUSCULAR | Status: AC
  Administered 2021-11-16: 50 ug via INTRAVENOUS

## 2021-11-16 MED ORDER — VANCOMYCIN HCL 1000 MG IV SOLR
INTRAVENOUS | Status: AC
  Filled 2021-11-16: qty 20

## 2021-11-16 MED ORDER — EPHEDRINE SULFATE (PRESSORS) 50 MG/ML IJ SOLN
INTRAMUSCULAR | Status: DC | PRN
  Administered 2021-11-16: 15 mg via INTRAVENOUS

## 2021-11-16 MED ORDER — FENTANYL CITRATE (PF) 100 MCG/2ML IJ SOLN
25.0000 ug | INTRAMUSCULAR | Status: DC | PRN
  Administered 2021-11-16 (×4): 25 ug via INTRAVENOUS

## 2021-11-16 MED ORDER — 0.9 % SODIUM CHLORIDE (POUR BTL) OPTIME
TOPICAL | Status: DC | PRN
  Administered 2021-11-16: 500 mL

## 2021-11-16 MED ORDER — CEFAZOLIN SODIUM-DEXTROSE 2-4 GM/100ML-% IV SOLN
2.0000 g | INTRAVENOUS | Status: AC
  Administered 2021-11-16 (×2): 2 g via INTRAVENOUS

## 2021-11-16 MED ORDER — DEXAMETHASONE SODIUM PHOSPHATE 10 MG/ML IJ SOLN
INTRAMUSCULAR | Status: AC
  Filled 2021-11-16: qty 1

## 2021-11-16 MED ORDER — LIDOCAINE HCL (CARDIAC) PF 100 MG/5ML IV SOSY
PREFILLED_SYRINGE | INTRAVENOUS | Status: DC | PRN
  Administered 2021-11-16: 100 mg via INTRAVENOUS

## 2021-11-16 MED ORDER — FENTANYL CITRATE (PF) 100 MCG/2ML IJ SOLN
INTRAMUSCULAR | Status: AC
  Filled 2021-11-16: qty 2

## 2021-11-16 MED ORDER — ONDANSETRON HCL 4 MG PO TABS: 4.0000 mg | ORAL_TABLET | Freq: Three times a day (TID) | ORAL | 0 refills | Status: AC | PRN

## 2021-11-16 MED ORDER — ONDANSETRON HCL 4 MG/2ML IJ SOLN
INTRAMUSCULAR | Status: DC | PRN
  Administered 2021-11-16: 4 mg via INTRAVENOUS

## 2021-11-16 MED ORDER — MELOXICAM 15 MG PO TABS: 15.0000 mg | ORAL_TABLET | Freq: Every day | ORAL | 0 refills | Status: AC

## 2021-11-16 MED ORDER — LIDOCAINE 2% (20 MG/ML) 5 ML SYRINGE
INTRAMUSCULAR | Status: AC
  Filled 2021-11-16: qty 5

## 2021-11-16 MED ORDER — LACTATED RINGERS IV SOLN: INTRAVENOUS | Status: DC | PRN

## 2021-11-16 MED ORDER — ONDANSETRON HCL 4 MG/2ML IJ SOLN
INTRAMUSCULAR | Status: AC
  Filled 2021-11-16: qty 2

## 2021-11-16 MED ORDER — ONDANSETRON HCL 4 MG/2ML IJ SOLN: 4.0000 mg | Freq: Four times a day (QID) | INTRAMUSCULAR | Status: DC | PRN

## 2021-11-16 MED ORDER — ACETAMINOPHEN 500 MG PO TABS
ORAL_TABLET | ORAL | Status: AC
  Filled 2021-11-16: qty 2

## 2021-11-16 SURGICAL SUPPLY — 89 items
AID PSTN UNV HD RSTRNT DISP (MISCELLANEOUS) ×2
APL PRP STRL LF DISP 70% ISPRP (MISCELLANEOUS) ×4
BIT DRILL QC 2X140 (BIT) IMPLANT
BLADE AVERAGE 25X9 (BLADE) IMPLANT
BLADE HEX COATED 2.75 (ELECTRODE) IMPLANT
BLADE SURG 10 STRL SS (BLADE) ×2 IMPLANT
BLADE SURG 15 STRL LF DISP TIS (BLADE) ×2 IMPLANT
BLADE SURG 15 STRL SS (BLADE) ×2
BNDG CMPR 5X4 CHSV STRCH STRL (GAUZE/BANDAGES/DRESSINGS)
BNDG COHESIVE 4X5 TAN STRL LF (GAUZE/BANDAGES/DRESSINGS) IMPLANT
CHLORAPREP W/TINT 26 (MISCELLANEOUS) ×2 IMPLANT
CLSR STERI-STRIP ANTIMIC 1/2X4 (GAUZE/BANDAGES/DRESSINGS) ×2 IMPLANT
CNTNR URN SCR LID CUP LEK RST (MISCELLANEOUS) IMPLANT
CONT SPEC 4OZ STRL OR WHT (MISCELLANEOUS) ×4
COOLER ICEMAN CLASSIC (MISCELLANEOUS) ×2 IMPLANT
DRAPE C-ARM 42X72 X-RAY (DRAPES) ×2 IMPLANT
DRAPE IMP U-DRAPE 54X76 (DRAPES) ×2 IMPLANT
DRAPE INCISE IOBAN 66X45 STRL (DRAPES) ×2 IMPLANT
DRAPE LAPAROTOMY T 102X78X121 (DRAPES) IMPLANT
DRAPE POUCH INSTRU U-SHP 10X18 (DRAPES) ×2 IMPLANT
DRAPE SURG 17X23 STRL (DRAPES) IMPLANT
DRAPE U-SHAPE 76X120 STRL (DRAPES) ×4 IMPLANT
DRSG AQUACEL AG ADV 3.5X 6 (GAUZE/BANDAGES/DRESSINGS) ×2 IMPLANT
DRSG AQUACEL AG ADV 3.5X10 (GAUZE/BANDAGES/DRESSINGS) IMPLANT
ELECT REM PT RETURN 9FT ADLT (ELECTROSURGICAL) ×2
ELECTRODE REM PT RTRN 9FT ADLT (ELECTROSURGICAL) ×2 IMPLANT
FEM CORT STRUT 15X100MM  HU (Miscellaneous) ×2 IMPLANT
FEM CORT STRUT 15X100MM HU (Miscellaneous) ×2 IMPLANT
FEMORAL CORT STRUT 15X100MM HU (Miscellaneous) ×2 IMPLANT
GAUZE XEROFORM 5X9 LF (GAUZE/BANDAGES/DRESSINGS) IMPLANT
GLOVE BIO SURGEON STRL SZ 6.5 (GLOVE) ×2 IMPLANT
GLOVE BIOGEL PI IND STRL 6.5 (GLOVE) ×2 IMPLANT
GLOVE BIOGEL PI IND STRL 8 (GLOVE) ×2 IMPLANT
GLOVE ECLIPSE 8.0 STRL XLNG CF (GLOVE) ×2 IMPLANT
GOWN STRL REUS W/ TWL LRG LVL3 (GOWN DISPOSABLE) ×4 IMPLANT
GOWN STRL REUS W/TWL LRG LVL3 (GOWN DISPOSABLE) ×4
GOWN STRL REUS W/TWL XL LVL3 (GOWN DISPOSABLE) ×2 IMPLANT
GRAFT BNE STRUT CORT FEM 15X10 (Miscellaneous) IMPLANT
K-WIRE 1.6X150 (WIRE) ×2
K-WIRE 2.0X150M (WIRE) ×2
KIT SHOULDER STAB MARCO (KITS) ×2 IMPLANT
KWIRE 1.6X150 (WIRE) IMPLANT
KWIRE 2.0X150M (WIRE) IMPLANT
PACK ARTHROSCOPY DSU (CUSTOM PROCEDURE TRAY) ×2 IMPLANT
PACK BASIN DAY SURGERY FS (CUSTOM PROCEDURE TRAY) ×2 IMPLANT
PAD COLD SHLDR WRAP-ON (PAD) ×2 IMPLANT
PENCIL SMOKE EVACUATOR (MISCELLANEOUS) ×2 IMPLANT
PLATE LCP CLAV LAT 2.7 RT (Plate) IMPLANT
PLATE LOCK LCP F/2.7X94 10H (Plate) IMPLANT
RESTRAINT HEAD UNIVERSAL NS (MISCELLANEOUS) ×2 IMPLANT
SCREW CORTEX 2.7X22 ST (Screw) IMPLANT
SCREW CORTEX 2.7X34MM SLF (Screw) IMPLANT
SCREW LOCK 2.7X28 (Screw) IMPLANT
SCREW LOCK ST 2.7X12 (Screw) ×2 IMPLANT
SCREW LOCK ST 2.7X18 (Screw) ×2 IMPLANT
SCREW LOCK ST 2.7X20 (Screw) IMPLANT
SCREW LOCK T8 12X2.7X2.1XST (Screw) IMPLANT
SCREW LOCK T8 18X2.7X2.1XST (Screw) IMPLANT
SCREW LOCK T8 22X2.7XST VA (Screw) IMPLANT
SCREW LOCK T8 24X2.7XSTVA (Screw) IMPLANT
SCREW LOCK VA ST 2.7X18 (Screw) IMPLANT
SCREW LOCK VA ST 2.7X20 (Screw) IMPLANT
SCREW LOCK VA ST 2.7X26 (Screw) IMPLANT
SCREW LOCKING 2.7X16MM VA (Screw) IMPLANT
SCREW LOCKING 2.7X22MM (Screw) ×2 IMPLANT
SCREW LOCKING 2.7X24MM (Screw) ×2 IMPLANT
SCREW LOCKING 2.7X36MM (Screw) IMPLANT
SCREW METAPHYSCAL 18MM (Screw) IMPLANT
SHEET MEDIUM DRAPE 40X70 STRL (DRAPES) ×2 IMPLANT
SLEEVE SCD COMPRESS KNEE MED (STOCKING) ×2 IMPLANT
SLING ARM FOAM STRAP LRG (SOFTGOODS) IMPLANT
SPIKE FLUID TRANSFER (MISCELLANEOUS) IMPLANT
SPONGE T-LAP 18X18 ~~LOC~~+RFID (SPONGE) ×2 IMPLANT
SPONGE T-LAP 4X18 ~~LOC~~+RFID (SPONGE) IMPLANT
STAPLER VISISTAT 35W (STAPLE) IMPLANT
SUCTION FRAZIER HANDLE 10FR (MISCELLANEOUS) ×2
SUCTION TUBE FRAZIER 10FR DISP (MISCELLANEOUS) ×2 IMPLANT
SUT BONE WAX W31G (SUTURE) IMPLANT
SUT MNCRL AB 4-0 PS2 18 (SUTURE) ×2 IMPLANT
SUT VIC AB 0 CT1 27 (SUTURE)
SUT VIC AB 0 CT1 27XCR 8 STRN (SUTURE) IMPLANT
SUT VIC AB 3-0 SH 27 (SUTURE) ×4
SUT VIC AB 3-0 SH 27X BRD (SUTURE) ×2 IMPLANT
SUT VIC AB PLUS 45CM 1-MO-4 (SUTURE) IMPLANT
SUT VICRYL 0 SH 27 (SUTURE) IMPLANT
SYR BULB EAR ULCER 3OZ GRN STR (SYRINGE) ×2 IMPLANT
TOWEL GREEN STERILE FF (TOWEL DISPOSABLE) ×2 IMPLANT
TUBE CONNECTING 20X1/4 (TUBING) IMPLANT
TUBE SUCTION HIGH CAP CLEAR NV (SUCTIONS) ×2 IMPLANT

## 2021-11-16 NOTE — Anesthesia Procedure Notes (Signed)
Anesthesia Regional Block: Interscalene brachial plexus block   Pre-Anesthetic Checklist: , timeout performed,  Correct Patient, Correct Site, Correct Laterality,  Correct Procedure, Correct Position, site marked,  Risks and benefits discussed,  Surgical consent,  Pre-op evaluation,  At surgeon's request and post-op pain management  Laterality: Right  Prep: chloraprep       Needles:  Injection technique: Single-shot  Needle Type: Echogenic Stimulator Needle     Needle Length: 5cm  Needle Gauge: 22     Additional Needles:   Procedures:, nerve stimulator,,,,,     Nerve Stimulator or Paresthesia:  Response: biceps flexion, 0.45 mA  Additional Responses:   Narrative:  Start time: 11/16/2021 11:16 AM End time: 11/16/2021 11:26 AM Injection made incrementally with aspirations every 5 mL.  Performed by: Personally  Anesthesiologist: Achille Rich, MD  Additional Notes: Functioning IV was confirmed and monitors were applied.  A 64mm 22ga Arrow echogenic stimulator needle was used. Sterile prep and drape,hand hygiene and sterile gloves were used.  Negative aspiration and negative test dose prior to incremental administration of local anesthetic. The patient tolerated the procedure well.  Ultrasound guidance: relevent anatomy identified, needle position confirmed, local anesthetic spread visualized around nerve(s), vascular puncture avoided.  Image printed for medical record.

## 2021-11-16 NOTE — Anesthesia Preprocedure Evaluation (Signed)
Anesthesia Evaluation  Patient identified by MRN, date of birth, ID band Patient awake    Reviewed: Allergy & Precautions, H&P , NPO status , Patient's Chart, lab work & pertinent test results  Airway Mallampati: II   Neck ROM: full    Dental   Pulmonary sleep apnea , former smoker,    breath sounds clear to auscultation       Cardiovascular hypertension,  Rhythm:regular Rate:Normal     Neuro/Psych    GI/Hepatic GERD  ,  Endo/Other    Renal/GU      Musculoskeletal  (+) Arthritis ,   Abdominal   Peds  Hematology   Anesthesia Other Findings   Reproductive/Obstetrics                             Anesthesia Physical Anesthesia Plan  ASA: 2  Anesthesia Plan: General   Post-op Pain Management: Regional block*   Induction:   PONV Risk Score and Plan: 2 and Ondansetron, Dexamethasone, Midazolam and Treatment may vary due to age or medical condition  Airway Management Planned: Oral ETT  Additional Equipment:   Intra-op Plan:   Post-operative Plan: Extubation in OR  Informed Consent: I have reviewed the patients History and Physical, chart, labs and discussed the procedure including the risks, benefits and alternatives for the proposed anesthesia with the patient or authorized representative who has indicated his/her understanding and acceptance.     Dental advisory given  Plan Discussed with: CRNA, Anesthesiologist and Surgeon  Anesthesia Plan Comments:         Anesthesia Quick Evaluation

## 2021-11-16 NOTE — Interval H&P Note (Signed)
All questions answered, patient wants to proceed with procedure. ? ?

## 2021-11-16 NOTE — Transfer of Care (Signed)
Immediate Anesthesia Transfer of Care Note  Patient: Roger Hays  Procedure(s) Performed: OPEN REDUCTION INTERNAL FIXATION (ORIF) ACROMION FRACTURE WITH ILLIAC CREST BONE GRAFT FROM RIGHT HIP (Right: Shoulder) HARVEST ILIAC BONE GRAFT (Right: Hip)  Patient Location: PACU  Anesthesia Type:General and Regional  Level of Consciousness: awake, alert  and oriented  Airway & Oxygen Therapy: Patient Spontanous Breathing and Patient connected to face mask oxygen  Post-op Assessment: Report given to RN and Post -op Vital signs reviewed and stable  Post vital signs: Reviewed and stable  Last Vitals:  Vitals Value Taken Time  BP 136/96 11/16/21 1530  Temp    Pulse 82 11/16/21 1532  Resp 13 11/16/21 1532  SpO2 96 % 11/16/21 1532  Vitals shown include unvalidated device data.  Last Pain:  Vitals:   11/16/21 1051  TempSrc: Oral  PainSc: 0-No pain         Complications: No notable events documented.

## 2021-11-16 NOTE — Progress Notes (Signed)
Assisted Dr. Hodierne with right, interscalene , ultrasound guided block. Side rails up, monitors on throughout procedure. See vital signs in flow sheet. Tolerated Procedure well. 

## 2021-11-16 NOTE — Anesthesia Procedure Notes (Signed)
Procedure Name: Intubation Date/Time: 11/16/2021 12:41 PM  Performed by: Verita Lamb, CRNAPre-anesthesia Checklist: Patient identified, Emergency Drugs available, Suction available and Patient being monitored Patient Re-evaluated:Patient Re-evaluated prior to induction Oxygen Delivery Method: Circle system utilized Preoxygenation: Pre-oxygenation with 100% oxygen Induction Type: IV induction Ventilation: Mask ventilation without difficulty Laryngoscope Size: Mac and 4 Grade View: Grade II Tube type: Oral Tube size: 7.0 mm Number of attempts: 1 Airway Equipment and Method: Stylet and Oral airway Placement Confirmation: ETT inserted through vocal cords under direct vision, positive ETCO2, breath sounds checked- equal and bilateral and CO2 detector Tube secured with: Tape Dental Injury: Teeth and Oropharynx as per pre-operative assessment

## 2021-11-16 NOTE — H&P (Signed)
PREOPERATIVE H&P  Chief Complaint: RIGHT ACROMION FRACTURE  HPI: Roger Hays is a 76 y.o. male who is scheduled for, Procedure(s): OPEN REDUCTION INTERNAL FIXATION (ORIF) ACROMION FRACTURE.   Patient has a past medical history significant for GERD, HTN, HLD, sleep apnea on CPAP.   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 He did well until around April when he began to have pain. He was noted to have a stress fracture. He was treated conservatively with a sling. He did well for a few months, but then again started having pain in August. He is having pain with motion overhead.  Pain is 10 out of 10.    Symptoms are rated as moderate to severe, and have been worsening.  This is significantly impairing activities of daily living.    Please see clinic note for further details on this patient's care.    He has elected for surgical management.   Past Medical History:  Diagnosis Date   Arthritis    Closed fracture of acromion    right   GERD (gastroesophageal reflux disease)    GI bleeding 12/08/2017   Hyperlipidemia    Hypertension    Sleep apnea    CPAP nightly   Past Surgical History:  Procedure Laterality Date   APPENDECTOMY     CARPAL TUNNEL RELEASE     COLONOSCOPY WITH PROPOFOL N/A 12/10/2017   Procedure: COLONOSCOPY WITH PROPOFOL;  Surgeon: Carman Ching, MD;  Location: [facility];  Service: Endoscopy;  Laterality: N/A;   ELBOW SURGERY     REVERSE SHOULDER ARTHROPLASTY Right 02/16/2021   Procedure: REVERSE SHOULDER ARTHROPLASTY;  Surgeon: Bjorn Pippin, MD;  Location: [facility];  Service: Orthopedics;  Laterality: Right;   SHOULDER SURGERY     TOTAL KNEE ARTHROPLASTY Bilateral    Social History   Socioeconomic History   Marital status: Married    Spouse name: Not on file   Number of children: Not on file   Years of education: Not on file   Highest education level: Not on file  Occupational History   Not on file   Tobacco Use   Smoking status: Former   Smokeless tobacco: Never  Vaping Use   Vaping Use: Never used  Substance and Sexual Activity   Alcohol use: Yes    Alcohol/week: 0.0 standard drinks of alcohol    Comment: SOCIAL   Drug use: Never   Sexual activity: Not on file  Other Topics Concern   Not on file  Social History Narrative   Not on file   Social Determinants of Health   Financial Resource Strain: Not on file  Food Insecurity: Not on file  Transportation Needs: Not on file  Physical Activity: Not on file  Stress: Not on file  Social Connections: Not on file   History reviewed. No pertinent family history. Allergies  Allergen Reactions   Oxycodone Hives   Pseudoephedrine Other (See Comments)    Unknown   Vicodin [Hydrocodone-Acetaminophen] Hives   Oxycontin [Oxycodone Hcl] Hives and Rash   Prior to Admission medications   Medication Sig Start Date End Date Taking? Authorizing Provider  losartan (COZAAR) 25 MG tablet Take 25 mg by mouth daily.   Yes [provider]  pantoprazole (PROTONIX) 40 MG tablet Take 40 mg by mouth daily. 11/27/17  Yes [provider]  Probiotic Product (ALIGN) 4 MG CAPS Take 4 mg by mouth daily.   Yes [provider]  rosuvastatin (CRESTOR) 10  MG tablet Take 10 mg by mouth daily. 11/27/17  Yes [provider]    ROS: All other systems have been reviewed and were otherwise negative with the exception of those mentioned in the HPI and as above.  Physical Exam: General: Alert, no acute distress Cardiovascular: No pedal edema Respiratory: No cyanosis, no use of accessory musculature GI: No organomegaly, abdomen is soft and non-tender Skin: No lesions in the area of chief complaint Neurologic: Sensation intact distally Psychiatric: Patient is competent for consent with normal mood and affect Lymphatic: No axillary or cervical lymphadenopathy  MUSCULOSKELETAL:  He has range of motion actively to 170 degrees  in the forward flexion position and he comes out more lateral.  He has significant pain in the anterior portion of his deltoid.  He has no obvious pain with internal and external rotation to the side.  Warm, well-perfused extremity.  Incisions are benign.    Imaging: X-rays and CT were reviewed.   1.  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  No significant change compared to previous that can't be accounted for in projectional changes.  His implants look to be in good position.   2.  His CT scan demonstrates a nonunion that is well corticated of the acromion with about 13 mm of displacement.    Assessment: RIGHT ACROMION FRACTURE Acromial nonunion.  Plan: Plan for Procedure(s): OPEN REDUCTION INTERNAL FIXATION (ORIF) ACROMION FRACTURE  The risks benefits and alternatives were discussed with the patient including but not limited to the risks of nonoperative treatment, versus surgical intervention including infection, bleeding, nerve injury,  blood clots, cardiopulmonary complications, morbidity, mortality, among others, and they were willing to proceed.   The patient acknowledged the explanation, agreed to proceed with the plan and consent was signed.   Operative Plan: ORIF right acromion stress fracture with bone grafting Discharge Medications: Standard DVT Prophylaxis: None Physical Therapy: Outpatient PT Special Discharge needs: Sling.    Vernetta Honey, PA-C  11/16/2021 8:37 AM

## 2021-11-16 NOTE — Op Note (Signed)
Orthopaedic Surgery Operative Note (CSN: [id number])  Roger Hays  [birth year] Date of Surgery: 11/16/2021   Diagnoses:  Right acromial nonunion  Procedure: Right acromion open reduction internal fixation of nonunion Right hip bone graft harvest   Operative Finding Successful completion of the planned procedure.  Patient's acromial nonunion was quite mobile.  I was unable to get the shoulder to dislocate and the joint seemed very stable throughout its arc of motion.  He had clear instability at the nonunion site and before even opening the shoulder we were able to demonstrate this with live fluoroscopy.  We cleared the fracture site and the iliac crest harvested was packed at the nonunion site as there was some attritional bone loss.  We under reduced his acromion to avoid increasing the stress across the bone as it was previously purposely under reducing to try and release tension on the deltoid.  We had good fixation with nonlocking screws distal to the fracture.  Final construct was robust.  Deltoid repair was robust.  Patient is extremely high complication risk secondary to the nature of the surgery.  We quoted his complication risk at almost 40%.  He will be maintained in a sling nonweightbearing full-time for 6 weeks to start gentle therapy afterwards.  Will not let him return to weightlifting until least month 5-6 and likely not to golf till month 5-6.  I will likely obtain a CT scan around month 4 postop  to check on his progress.  Post-operative plan: The patient will be nonweightbearing in a sling.  The patient will be discharged home.  DVT prophylaxis Aspirin 81 mg twice daily for 6 weeks.   Pain control with PRN pain medication preferring oral medicines.  Follow up plan will be scheduled in approximately 7 days for incision check and XR.  He will likely need to be seen about 20 days in for removal of his nonabsorbable sutures  Post-Op Diagnosis: Same Surgeons:Primary: Bjorn Pippin,  MD Assistants:Caroline McBane PA-C Location: [facility] OR ROOM 5 Anesthesia: General with regional anesthesia Antibiotics: Ancef 2 g with local vancomycin powder 1 g at the surgical site Tourniquet time:  Estimated Blood Loss: 50 Complications: None Specimens: None Implants: Implant Name Type Inv. Item Serial No. Manufacturer Lot No. LRB No. Used Action  FEM CORT STRUT 15X100MM  HU - [id number] Miscellaneous FEM CORT STRUT 15X100MM  HU [id number] [employer] [id number] Right 1 Implanted  2.67mm VA  LCP Clavicle Plates CS3 Right    DEPUY SYNTHES IN TRAY Right 1 Implanted  2.53mm Self tapping screw Screw   DEPUY SYNTHES  Right 1 Implanted  SCREW LOCK VA ST 2.7X20 - [id number] Screw SCREW LOCK VA ST 2.7X20  [employer]TH[employer]  Right 3 Implanted  2.71mm Self tapping screw Screw   DEPUY SYNTHES  Right 1 Implanted  2.7 Cortex screw Screw   DEPUY SYNTHES  Right 1 Implanted  SCREW LOCK VA ST 2.7X18 - [id number] Screw SCREW LOCK VA ST 2.7X18  DEPUY ORTHOPA[employer]  Right 1 Implanted  SCREW LOCKING 2.7X22MM - [id number] Screw SCREW LOCKING 2.7X22MM  DEPUY ORTH[employer]  Right 1 Implanted  SCREW LOCKING 2.7X24MM - [id number] Screw SCREW LOCKING 2.7X24MM  DEPU[employer] ORTH[employer]  Right 1 Implanted  2.7 10 hole plate Plate  [id number] DEPUY SYNTHES  Right 1 Implanted  SCREW CORTEX 2.7X22 ST - [id number] Screw SCREW CORTEX 2.7X22 ST  DEPUY ORTHOPAEDICS  Right 2 Implanted  SCREW LOCKING 2.7X36MM - [id number] Screw SCREW LOCKING 2.7X36MM  DEPU[employer] ORTH[employer]  Right 1 Implanted  2.24mm locking screw Screw   DEPUY SYNTHES  Right 1 Implanted  2.7 locking screw Screw   DEPUY SYNTHES  Right 1 Implanted  SCREW LOCK ST 2.7X18 - [id number] Screw SCREW LOCK ST 2.7X18  DEPU[employer] ORTHOPA[employer]  Right 1 Implanted  SCREW LOCK ST 2.7X12 - [id number] Screw SCREW LOCK ST 2.7X12  DEPUY ORTHOPA[employer]  Right 1 Implanted    Indications for Surgery:   Roger Hays is a 76 y.o. 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  Unfortunately he continues to have instability at the fracture site as well as likely at the joint itself due to the reduced deltoid tension.  Benefits and risks of operative and nonoperative management were discussed prior to surgery with patient/guardian(s) and informed consent form was completed.  Specific risks including infection, need for additional surgery, continued nonunion hardware failure, hardware prominence, wound dehiscence and deltoid dehiscence amongst others.  We quoted his complication rate at almost 40%.   Procedure:   The patient was identified properly. Informed consent was obtained and the surgical site was marked. The patient was taken up to suite where general anesthesia was induced.  The patient was positioned initially supine on a Allen table..  The right hip was prepped and draped in the usual sterile fashion.  Timeout was performed before the beginning of the case.  We began by identifying the iliac crest.  We made a 5 cm incision just posterior about 2 cm from the ASIS.  With the skin sharply and hemostasis we progressed.  We went through the layers very carefully identifying the external oblique and raising these as a full-thickness flap taking care to avoid airing medial into the pelvis.  We identified the bony ridge of the iliac crest itself.  We placed blunt retractors to keep the lateral femoral cutaneous nerve out of the field.  We made a corticotomy using a osteotome.  We took a tricortical small 1 cm graft as well as about 5 cc of cancellous autograft.  Once this was complete we irrigated copiously and placed bone wax at the site.  We then closed carefully in a multilayer fashion with adorable suture.  Aquacel dressing was placed.  We broke our drapes down and prepared for shoulder.  Our patient was reprepped and draped in the beachchair position on Bushnell table.  We checked initial fluoroscopic imaging and  performed a live exam under anesthesia with fluoroscopy to ensure that the joint was not dislocating and that there was significant demonstrated motion of the acromial site.  This point we began with our approach.  A 12 cm approach was made just distal to the scapular spine to keep her incision out of plane with our plate.  Went through skin sharply achieving hemostasis we progressed.  I made full-thickness skin flaps and elevated the superiorly.  That point we able to identify the deltoid and our fracture site.  We incised the fascia deep overlying the scapular spine and acromion.  We elevated full-thickness deltoid flaps subperiosteally elevating.  We identified the fracture site.  There is significant fibrous nonunion tissue.  There was attritional bone loss especially posterior.  We were able to clear the interposed nonunion fibrous tissue.  There is clearly mobility of the fracture site itself.  At that point we used a curette to stimulate the bone at the fracture site.  We then used an 062 K wire to perform multiple drill holes into the fracture site both  proximal and distal fragments to create marrow E flux and aid in healing.  At that point we used fluoroscopy as well as direct manipulation to reduce the fracture purposely under reducing to avoid excess tension on the deltoid.  Once we have provisionally reduced we packed a small amount of bone graft in place and used clamps to hold it compressed.  We did attempt to use a cortical femoral strut underneath the acromion however as the fracture was quite near the scapular spine base this did not fit and we aborted this portion of the procedure.  We placed 2 K wires to hold a provisional reduction and then selected a long Synthes lateral clavicle plate.  We contoured the plate to suit our anatomy.  We were able to appropriately contoured the plate and then secured it proximally before fixing it distally using fluoroscopic and direct visual guidance.  The  plate was bent in plane and out of plane.  We had 6 locking screws distal to the fracture site.  Good good overall fixation.  To aid in fixation of this difficult fracture we felt that a second plate was necessary.  We contoured a 2.7 mini frag plate from [employer]ynthe[employer].  Once it was appropriately contoured we placed it along the posterior rim of the scapular spine and acromion.  We had good provisional fixation with nonlocking screws and placed locking screws in addition.  In all we had 9 screws in the distal fragment with good purchase.  We irrigated copiously and noted that there was attritional bone loss posterior.  We used our remaining bone graft to pack into the space and use our tricortical fragment to tamp into place to aid in bone healing.  Final construct moved as a unit and final fluoroscopic images demonstrated anatomic reduction.  We irrigated before we packed bone graft but we then placed a small amount of vancomycin powder at our site.  We then closed the deep deltoid fascial layer over top of our plate taking great care to perform a watertight closure.  We then closed the incision in a multilayer fashion finishing with 2-0 nylon suture.  Sterile dressing and sling were placed.  Patient awoken taken back in stable condition.  Alfonse Alpers, PA-C, present and scrubbed throughout the case, critical for completion in a timely fashion, and for retraction, instrumentation, closure.

## 2021-11-16 NOTE — Discharge Instructions (Addendum)
Ramond Marrow MD, MPH Alfonse Alpers, PA-C [facility] [address], [address] [phone] (tel)   [phone] (fax)   POST-OPERATIVE INSTRUCTIONS    WOUND CARE You may leave the operative dressing in place until your follow-up appointment. (On your shoulder and your hip) KEEP THE INCISIONS CLEAN AND DRY. There may be a small amount of fluid/bleeding leaking at the surgical site. This is normal after surgery.  If it fills with liquid or blood please call us immediately to change it for you. Use the provided ice machine or Ice packs as often as possible for the first 3-4 days, then as needed for pain relief.   Keep a layer of cloth or a shirt between your skin and the cooling unit to prevent frost bite as it can get very cold.  SHOWERING: - You may shower on Post-Op Day #2.  - The dressing is water resistant but do not scrub it as it may start to peel up.   - You may remove the sling for showering - Gently pat the area dry.  - Do not soak the shoulder or hip in water.  - Do not go swimming in the pool or ocean until your incision has completely healed (about 4-6 weeks after surgery) - KEEP THE INCISIONS CLEAN AND DRY.  EXERCISES Wear the sling at all times  You may remove the sling for showering, but keep the arm across the chest or in a secondary sling.    It is normal for your fingers/hand to become more swollen after surgery and discolored from bruising.   This will resolve over the first few weeks usually after surgery.  - You can put full weight on your right leg as you feel comfortable  - DO NOT DO ANY HEAVY LIFTING - DO NOT STRAIN YOUR ABDOMINAL MUSCLES    REGIONAL ANESTHESIA (NERVE BLOCKS) The anesthesia team may have performed a nerve block for you this is a great tool used to minimize pain.   The block may start wearing off overnight (between 8-24 hours postop) When the block wears off, your pain may go from nearly zero to the pain you would  have had postop without the block. This is an abrupt transition but nothing dangerous is happening.   This can be a challenging period but utilize your as needed pain medications to try and manage this period. We suggest you use the pain medication the first night prior to going to bed, to ease this transition.  You may take an extra dose of narcotic when this happens if needed   POST-OP MEDICATIONS- Multimodal approach to pain control In general your pain will be controlled with a combination of substances.  Prescriptions unless otherwise discussed are electronically sent to your pharmacy.  This is a carefully made plan we use to minimize narcotic use.     Meloxicam - Anti-inflammatory medication taken on a scheduled basis Acetaminophen - Non-narcotic pain medicine taken on a scheduled basis  Oxycodone - This is a strong narcotic, to be used only on an "as needed" basis for SEVERE pain. Aspirin 81mg  - This medicine is used to minimize the risk of blood clots after surgery. Robaxin - this is a muscle relaxer, take as needed for muscle spasms Zofran -  take as needed for nausea   FOLLOW-UP If you develop a Fever (>101.5), Redness or Drainage from the surgical incision site, please call our office to arrange for an evaluation. Please call the office to schedule  a follow-up appointment for a wound check, 7-10 days post-operatively.  IF YOU HAVE ANY QUESTIONS, PLEASE FEEL FREE TO CALL OUR OFFICE.  HELPFUL INFORMATION  Your arm will be in a sling following surgery. You must be in your sling at all times except for showering.   You may be more comfortable sleeping in a semi-seated position the first few nights following surgery.  Keep a pillow propped under the elbow and forearm for comfort.  If you have a recliner type of chair it might be beneficial.  If not that is fine too, but it would be helpful to sleep propped up with pillows behind your operated shoulder as well under your elbow and  forearm.  This will reduce pulling on the suture lines.  When dressing, put your operative arm in the sleeve first.  When getting undressed, take your operative arm out last.  Loose fitting, button-down shirts are recommended.  In most states it is against the law to drive while your arm is in a sling. And certainly against the law to drive while taking narcotics.  You may return to work/school in the next couple of days when you feel up to it. Desk work and typing in the sling is fine.  We suggest you use the pain medication the first night prior to going to bed, in order to ease any pain when the anesthesia wears off. You should avoid taking pain medications on an empty stomach as it will make you nauseous.  You should wean off your narcotic medicines as soon as you are able.     Most patients will be off or using minimal narcotics before their first postop appointment.   Do not drink alcoholic beverages or take illicit drugs when taking pain medications.  Pain medication may make you constipated.  Below are a few solutions to try in this order: Decrease the amount of pain medication if you aren't having pain. Drink lots of decaffeinated fluids. Drink prune juice and/or each dried prunes  If the first 3 don't work start with additional solutions Take Colace - an over-the-counter stool softener Take Senokot - an over-the-counter laxative Take Miralax - a stronger over-the-counter laxative    For more information including helpful videos and documents visit our website:   [email or web address]    Post Anesthesia Home Care Instructions  Activity: Get plenty of rest for the remainder of the day. A responsible individual must stay with you for 24 hours following the procedure.  For the next 24 hours, DO NOT: -Drive a car -Paediatric nurse -Drink alcoholic beverages -Take any medication unless instructed by your physician -Make any legal decisions  or sign important papers.  Meals: Start with liquid foods such as gelatin or soup. Progress to regular foods as tolerated. Avoid greasy, spicy, heavy foods. If nausea and/or vomiting occur, drink only clear liquids until the nausea and/or vomiting subsides. Call your physician if vomiting continues.  Special Instructions/Symptoms: Your throat may feel dry or sore from the anesthesia or the breathing tube placed in your throat during surgery. If this causes discomfort, gargle with warm salt water. The discomfort should disappear within 24 hours.      Regional Anesthesia Blocks  1. Numbness or the inability to move the "blocked" extremity may last from 3-48 hours after placement. The length of time depends on the medication injected and your individual response to the medication. If the numbness is not going away after 48 hours, call your surgeon.  2. The extremity that  is blocked will need to be protected until the numbness is gone and the  Strength has returned. Because you cannot feel it, you will need to take extra care to avoid injury. Because it may be weak, you may have difficulty moving it or using it. You may not know what position it is in without looking at it while the block is in effect.  3. For blocks in the legs and feet, returning to weight bearing and walking needs to be done carefully. You will need to wait until the numbness is entirely gone and the strength has returned. You should be able to move your leg and foot normally before you try and bear weight or walk. You will need someone to be with you when you first try to ensure you do not fall and possibly risk injury.  4. Bruising and tenderness at the needle site are common side effects and will resolve in a few days.  5. Persistent numbness or new problems with movement should be communicated to the surgeon or the [facility] [phone] [facility] [phone]).   Next dose of Tylenol can be  taken at 5pm today.

## 2021-11-17 NOTE — Anesthesia Postprocedure Evaluation (Signed)
Anesthesia Post Note  Patient: Roger Hays  Procedure(s) Performed: OPEN REDUCTION INTERNAL FIXATION (ORIF) ACROMION FRACTURE WITH ILLIAC CREST BONE GRAFT FROM RIGHT HIP (Right: Shoulder) HARVEST ILIAC BONE GRAFT (Right: Hip)     Patient location during evaluation: PACU Anesthesia Type: General and Regional Level of consciousness: awake and alert Pain management: pain level controlled Vital Signs Assessment: post-procedure vital signs reviewed and stable Respiratory status: spontaneous breathing, nonlabored ventilation, respiratory function stable and patient connected to nasal cannula oxygen Cardiovascular status: blood pressure returned to baseline and stable Postop Assessment: no apparent nausea or vomiting Anesthetic complications: no   No notable events documented.  Last Vitals:  Vitals:   11/16/21 1643 11/16/21 1717  BP:  (!) 142/76  Pulse: 79 80  Resp: 19   Temp:  36.6 C  SpO2: 95% 97%    Last Pain:  Vitals:   11/16/21 1717  TempSrc: Oral  PainSc: 3                  [name]

## 2021-11-22 ENCOUNTER — Encounter (HOSPITAL_BASED_OUTPATIENT_CLINIC_OR_DEPARTMENT_OTHER): Payer: Self-pay | Admitting: Orthopaedic Surgery

## 2022-03-28 IMAGING — CR DG SHOULDER 2+V PORT*R*
1 series · 1 of 1 positions shown · non-contrast
Comparison: None

CLINICAL DATA: Postoperative check of reverse shoulder
arthroplasty, single view.

EXAM:
PORTABLE RIGHT SHOULDER

[shoulder ap]
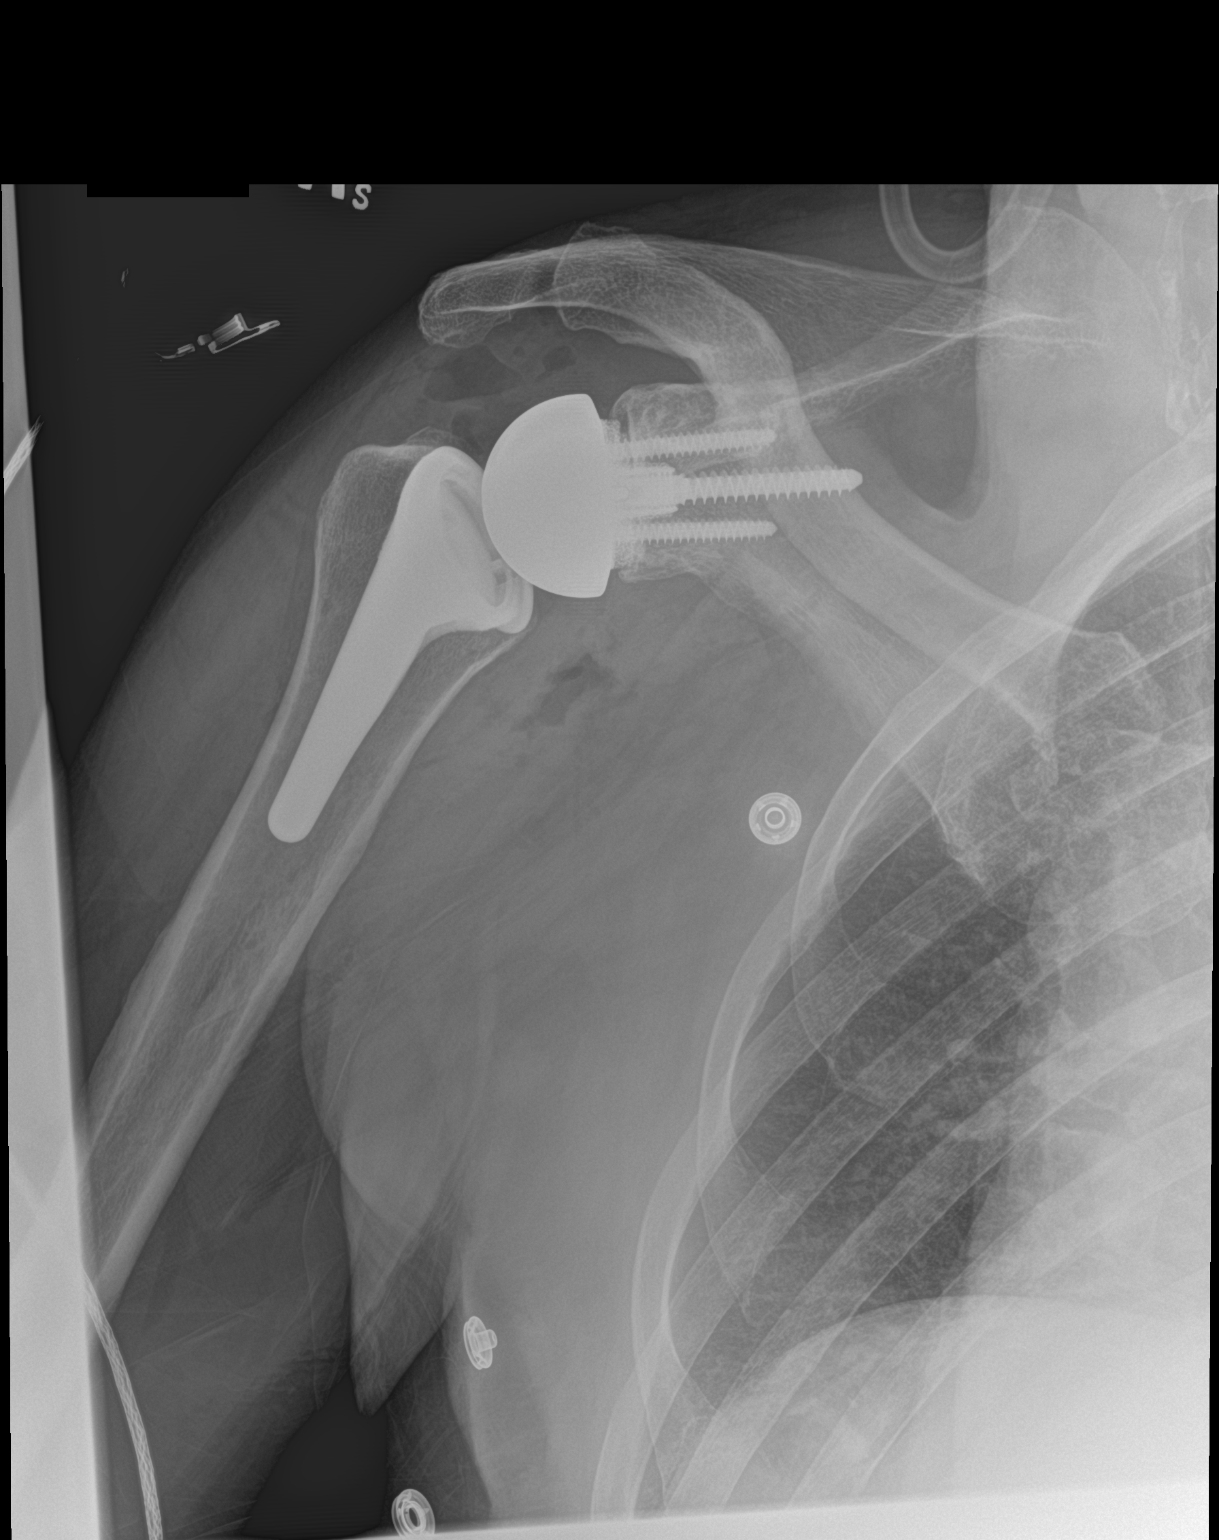

[1 of 1 positions shown; findings below may reference images not displayed]

FINDINGS: Gas in the soft tissues about the RIGHT shoulder. Signs of reverse
shoulder arthroplasty without signs of immediate complication on
single view.
IMPRESSION: Single view with changes of RIGHT shoulder reverse shoulder
arthroplasty. No immediate complications.

## 4630-05-10 DEATH — deceased
